# Patient Record
Sex: Male | Born: 1980 | Hispanic: No | Marital: Married | Smoking: Former smoker
Health system: Southern US, Community
[De-identification: ages and names within clinical notes are randomized; demographics above are authoritative.]

## PROBLEM LIST (undated history)

## (undated) DIAGNOSIS — E669 Obesity, unspecified: Secondary | ICD-10-CM

## (undated) DIAGNOSIS — K1120 Sialoadenitis, unspecified: Secondary | ICD-10-CM

## (undated) HISTORY — PX: WISDOM TOOTH EXTRACTION: SHX21

## (undated) HISTORY — DX: Obesity, unspecified: E66.9

## (undated) HISTORY — DX: Sialoadenitis, unspecified: K11.20

---

## 2001-10-17 HISTORY — PX: KNEE SURGERY: SHX244

## 2004-10-17 HISTORY — PX: OTHER SURGICAL HISTORY: SHX169

## 2005-07-03 ENCOUNTER — Ambulatory Visit (HOSPITAL_BASED_OUTPATIENT_CLINIC_OR_DEPARTMENT_OTHER): Admission: RE | Admit: 2005-07-03 | Discharge: 2005-07-03 | Payer: Self-pay | Admitting: Family Medicine

## 2005-07-10 ENCOUNTER — Ambulatory Visit: Payer: Self-pay | Admitting: Internal Medicine

## 2005-09-25 ENCOUNTER — Ambulatory Visit (HOSPITAL_BASED_OUTPATIENT_CLINIC_OR_DEPARTMENT_OTHER): Admission: RE | Admit: 2005-09-25 | Discharge: 2005-09-25 | Payer: Self-pay | Admitting: Family Medicine

## 2005-10-02 ENCOUNTER — Ambulatory Visit: Payer: Self-pay | Admitting: Internal Medicine

## 2007-08-19 LAB — CBC AND DIFFERENTIAL
Hemoglobin: 15.4 g/dL (ref 13.5–17.5)
Platelets: 209 10*3/uL (ref 150–399)

## 2007-11-16 ENCOUNTER — Ambulatory Visit: Payer: Self-pay

## 2007-12-25 ENCOUNTER — Ambulatory Visit: Payer: Self-pay | Admitting: Unknown Physician Specialty

## 2010-08-09 ENCOUNTER — Ambulatory Visit: Payer: Self-pay | Admitting: Family Medicine

## 2010-08-11 ENCOUNTER — Telehealth (INDEPENDENT_AMBULATORY_CARE_PROVIDER_SITE_OTHER): Payer: Self-pay | Admitting: *Deleted

## 2010-10-16 ENCOUNTER — Ambulatory Visit
Admission: RE | Admit: 2010-10-16 | Discharge: 2010-10-16 | Payer: Self-pay | Source: Home / Self Care | Admitting: Family Medicine

## 2010-10-16 LAB — CONVERTED CEMR LAB: Rapid Strep: NEGATIVE

## 2010-10-21 ENCOUNTER — Encounter: Payer: Self-pay | Admitting: Family Medicine

## 2010-11-16 NOTE — Assessment & Plan Note (Signed)
Summary: POSS BROKEN RIB/TJ rm 5   Vital Signs:  Patient Profile:   30 Years Old Male CC:      posterior right sided rib pain post fall today Height:     73 inches Weight:      286.50 pounds O2 Sat:      98 % O2 treatment:    Room Air Temp:     98.9 degrees F oral Pulse rate:   90 / minute Resp:     18 per minute BP sitting:   133 / 88 (left arm)  Pt. in pain?   yes    Location:   right mid back    Intensity:   7    Type:       sharp  Vitals Entered By: Lajean Saver, RN  (August 09, 2010 11:22 AM)                   Updated Prior Medication List: No Medications Current Allergies: No known allergies History of Present Illness Chief Complaint: posterior right sided rib pain post fall today  REVIEW OF SYSTEMS   Respiratory       Comments: painful inspiration    Musculoskeletal       Complains of muscle pain.   Other Comments: Patient was dumping a wheelbarrow this AM lost his balance and fell onto it onhis side then rolledto his back. He is not havingany diffiulty breathing but it is painful to inhale.    Past History:  Past Medical History: Unremarkable  Past Surgical History: Bilateral knee Sx  Family History: Family History Diabetes 1st degree relative Family History of Stroke M 1st degree relative <50  Social History: Married Current Smoker < 1PPWeek Alcohol use-yes 2x a week Drug use-no Regular exercise-yes Smoking Status:  current Drug Use:  no Does Patient Exercise:  yes   Objective:  No acute distress but moves slowly Eyes:  Pupils are equal, round, and reactive to light and accomdation.  Extraocular movement is intact.  Conjunctivae are not inflamed.  Neck:  Full range of motion.  Lungs:  Clear to auscultation.  Breath sounds are equal.  Chest:  Tender right posterior/lateral ribs from beneath scapula to approximately T12 Heart:  Regular rate and rhythm without murmurs, rubs, or gallops.  Abdomen:  Nontender without masses or  hepatosplenomegaly.  Bowel sounds are present.  No CVA or flank tenderness.   Back:  Can heel/toe walk and squat without difficulty.  Decreased forward flexion.  Tenderness in the midline and   right paraspinous muscles from L3 to Sacral area.  Straight leg raising test is negative.  Sitting knee extension test is negative.  Strength and sensation in the lower extremities is normal.  Patellar and achilles reflexes are normal.   RIGHT RIBS AND CHEST - 3+ VIEW   Comparison: None.   Findings:  Heart and lungs normal.  No rib fractures or lesions. No pneumothorax or hemothorax.   IMPRESSION: No acute or significant findings.  LUMBAR SPINE - COMPLETE 4+ VIEW   Comparison: None.   Findings: Normal lumbar segmentation. Bone mineralization is within normal limits.  Normal vertebral height and alignment.  Relatively preserved disc spaces.  No pars fracture.  Sacrum SI joints are within normal limits.   IMPRESSION: No acute fracture or listhesis identified in the lumbar spine.    Assessment New Problems: LOW BACK PAIN, ACUTE (ICD-724.2) CONTUSION, RIGHT CHEST WALL (ICD-922.1) FAMILY HISTORY DIABETES 1ST DEGREE RELATIVE (ICD-V18.0)   Plan New Medications/Changes:  LORTAB 7.5 7.5-500 MG TABS (HYDROCODONE-ACETAMINOPHEN) 1 by mouth q6hr as needed pain  #12 (twelve) x 0, 08/09/2010, Donna Christen MD NAPROXEN 500 MG TABS (NAPROXEN) One by mouth two times a day pc  #20 x 1, 08/09/2010, Donna Christen MD  New Orders: T-DG Ribs Unilateral w/Chest*R* [71101] T-DG Lumbar Spine Complete [72110] Ketorolac-Toradol 15mg  [J1885] Influenza Vaccine NON MCR [00028] Admin of Therapeutic Inj  intramuscular or subcutaneous [96372] Rib Belt [L0210] New Patient Level III [64403] Planning Comments:   Dispensed rb belt:  wear 5 to 7 days.  Begin ice packs.  Begin Naproxen, analgesic. Begin exercises in about 5 days ([facility] information and instruction patient handout given)  Administered flu shot at  wife's request. Follow-up with PCP if not improving.   The patient and/or caregiver has been counseled thoroughly with regard to medications prescribed including dosage, schedule, interactions, rationale for use, and possible side effects and they verbalize understanding.  Diagnoses and expected course of recovery discussed and will return if not improved as expected or if the condition worsens. Patient and/or caregiver verbalized understanding.  Prescriptions: LORTAB 7.5 7.5-500 MG TABS (HYDROCODONE-ACETAMINOPHEN) 1 by mouth q6hr as needed pain  #12 (twelve) x 0   Entered and Authorized by:   Donna Christen MD   Signed by:   Donna Christen MD on 08/09/2010   Method used:   Print then Give to Patient   RxID:   [id number] NAPROXEN 500 MG TABS (NAPROXEN) One by mouth two times a day pc  #20 x 1   Entered and Authorized by:   Donna Christen MD   Signed by:   Donna Christen MD on 08/09/2010   Method used:   Print then Give to Patient   RxID:   [id number]   Medication Administration  Injection # 1:    Medication: Ketorolac-Toradol 15mg     Diagnosis: LOW BACK PAIN, ACUTE (ICD-724.2)    Route: IM    Site: RUOQ gluteus    Exp Date: 05/17/2012    Lot #: [id number]    Mfr: [employer]    Comments: 60 mgs given    Patient tolerated injection without complications    Given by: Emilio Math (August 09, 2010 12:51 PM)  Orders Added: 1)  T-DG Ribs Unilateral w/Chest*R* [71101] 2)  T-DG Lumbar Spine Complete [72110] 3)  Ketorolac-Toradol 15mg  [J1885] 4)  Influenza Vaccine NON MCR [00028] 5)  Admin of Therapeutic Inj  intramuscular or subcutaneous [96372] 6)  Rib Belt [L0210] 7)  New Patient Level III [35573]   Immunizations Administered:  Influenza Vaccine # 1:    Vaccine Type: Fluzone    Site: right deltoid    Mfr: Fluzone    Dose: 0.5 ml    Route: IM    Given by: Emilio Math    Exp. Date: 04/16/2011    Lot #: [id number]    VIS given: 05/11/10 version given August 09, 2010.  Flu Vaccine Consent Questions:    Do you have a history of severe allergic reactions to this vaccine? no    Any prior history of allergic reactions to egg and/or gelatin? no    Do you have a sensitivity to the preservative Thimersol? no    Do you have a past history of Guillan-Barre Syndrome? no    Do you currently have an acute febrile illness? no    Have you ever had a severe reaction to latex? no    Vaccine information given and explained to patient? yes   Immunizations Administered:  Influenza Vaccine # 1:    Vaccine Type: Fluzone    Site: right deltoid    Mfr: Fluzone    Dose: 0.5 ml    Route: IM    Given by: Emilio Math    Exp. Date: 04/16/2011    Lot #: [id number]    VIS given: 05/11/10 version given August 09, 2010.   Medication Administration  Injection # 1:    Medication: Ketorolac-Toradol 15mg     Diagnosis: LOW BACK PAIN, ACUTE (ICD-724.2)    Route: IM    Site: RUOQ gluteus    Exp Date: 05/17/2012    Lot #: [id number]    Mfr: [employer] [facility]    Comments: 60 mgs given    Patient tolerated injection without complications    Given by: Emilio Math (August 09, 2010 12:51 PM)  Orders Added: 1)  T-DG Ribs Unilateral w/Chest*R* [71101] 2)  T-DG Lumbar Spine Complete [72110] 3)  Ketorolac-Toradol 15mg  [J1885] 4)  Influenza Vaccine NON MCR [00028] 5)  Admin of Therapeutic Inj  intramuscular or subcutaneous [96372] 6)  Rib Belt [L0210] 7)  New Patient Level III [11914]

## 2010-11-16 NOTE — Letter (Signed)
Summary: Out of Work  [facility]  [address]   [place], [place] [address]   Phone: [phone]  Fax: [phone]    August 09, 2010   Patient:  Mason Nichols    To Whom It May Concern:   For Medical reasons, Mr. Journey has been prescribed a rib belt which he should wear for 5 to 7 days.    If you need additional information, please feel free to contact our office.         Sincerely,    Donna Christen MD

## 2010-11-16 NOTE — Letter (Signed)
Summary: FLU VAC CONSENT FORM  FLU VAC CONSENT FORM   Imported By: Dannette Barbara 08/09/2010 12:27:28  _____________________________________________________________________  External Attachment:    Type:   Image     Comment:   External Document

## 2010-11-16 NOTE — Progress Notes (Signed)
  Phone Note Outgoing Call Call back at [facility] Phone [phone]   Call placed by: Lajean Saver RN,  August 11, 2010 12:49 PM Call placed to: Patient Summary of Call: Callback: No answer. Message left with reason for call and call back with any questions or concerns.

## 2010-11-16 NOTE — Letter (Signed)
Summary: RELEASE OF RECORDS  RELEASE OF RECORDS   Imported By: Dannette Barbara 08/09/2010 12:30:30  _____________________________________________________________________  External Attachment:    Type:   Image     Comment:   External Document

## 2010-11-18 NOTE — Assessment & Plan Note (Signed)
Summary: PNUMONIA/TM room 5   Vital Signs:  Patient Profile:   30 Years Old Male CC:      Cough Height:     73 inches Weight:      286 pounds O2 Sat:      98 % O2 treatment:    Room Air Temp:     99.5 degrees F oral Pulse rate:   95 / minute Resp:     20 per minute BP sitting:   129 / 88  (left arm) Cuff size:   large  Vitals Entered By: Clemens Catholic LPN (October 16, 2010 9:25 AM)                  Updated Prior Medication List: No Medications Current Allergies (reviewed today): No known allergies History of Present Illness Chief Complaint: Cough History of Present Illness:  Subjective: Patient complains URI symptoms that started 4 days ago with sore throat and nasal congestion.  He has had pneumonia in the past. + productive cough started yesterday. No pleuritic pain No wheezing  + nasal congestion +  post-nasal drainage ? sinus pain/pressure No itchy/red eyes + left earache No hemoptysis No SOB + fever/chills No nausea No vomiting No abdominal pain No diarrhea No skin rashes + fatigue No myalgias No headache Used OTC meds without relief   REVIEW OF SYSTEMS Constitutional Symptoms       Complains of fever, chills, and night sweats.     Denies weight loss, weight gain, and fatigue.  Eyes       Complains of eye drainage, glasses, and contact lenses.      Denies change in vision, eye pain, and eye surgery. Ear/Nose/Throat/Mouth       Complains of ear pain, frequent runny nose, and sore throat.      Denies hearing loss/aids, change in hearing, ear discharge, dizziness, frequent nose bleeds, sinus problems, hoarseness, and tooth pain or bleeding.  Respiratory       Complains of productive cough.      Denies dry cough, wheezing, shortness of breath, asthma, bronchitis, and emphysema/COPD.  Cardiovascular       Denies murmurs, chest pain, and tires easily with exhertion.    Gastrointestinal       Denies stomach pain, nausea/vomiting, diarrhea,  constipation, blood in bowel movements, and indigestion. Genitourniary       Denies painful urination, kidney stones, and loss of urinary control. Neurological       Complains of weakness.      Denies headaches, loss of or changes in sensation, numbness, tngling, tremors, paralysis, seizures, and fainting/blackouts. Musculoskeletal       Complains of muscle pain and joint pain.      Denies joint stiffness, decreased range of motion, redness, swelling, muscle weakness, and gout.  Skin       Denies bruising, unusual mles/lumps or sores, and hair/skin or nail changes.  Psych       Denies mood changes, temper/anger issues, anxiety/stress, speech problems, depression, and sleep problems. Other Comments: pt c/o runny nose x tues PM. This AM he states that it feels like it has moved to his chest and throat. he has yellow/ brown mucus. he has tried OTC mucinex multi-symptom and chloraseptic spray. he also states that his left ear hurts today. pt has a hx of pneumonia.   Past History:  Past Medical History: Reviewed history from 08/09/2010 and no changes required. Unremarkable  Past Surgical History: Bilateral knee Sx salivary gland removed  Family  History: Reviewed history from 08/09/2010 and no changes required. Family History Diabetes 1st degree relative Family History of Stroke M 1st degree relative <50  Social History: Reviewed history from 08/09/2010 and no changes required. Married Current Smoker < 1PPWeek Alcohol use-yes 2x a week Drug use-no Regular exercise-yes   Objective:  Appearance:  Patient appears healthy, stated age, and in no acute distress  Eyes:  Pupils are equal, round, and reactive to light and accomdation.  Extraocular movement is intact.  Conjunctivae are not inflamed.  Ears:  Canals normal.  Tympanic membranes normal.   Nose:  Normal septum.  Normal turbinates, mildly congested.   No sinus tenderness present.  Pharynx:  Normal  Neck:  Supple.  No adenopathy  is present.  No thyromegaly is present  Lungs:  Clear to auscultation.  Breath sounds are equal.  Heart:  Regular rate and rhythm without murmurs, rubs, or gallops.  Abdomen:  Nontender without masses or hepatosplenomegaly.  Bowel sounds are present.  No CVA or flank tenderness.  Extremities:  No edema.   Skin:  No rash Rapid strep test negative  Assessment New Problems: UPPER RESPIRATORY INFECTION, VIRAL (ICD-465.9)  VIRAL URI; WITH HISTORY OF PNEUMONIA, WILL BEGIN ANTIBIOTIC COVERAGE  Plan New Medications/Changes: BENZONATATE 200 MG CAPS (BENZONATATE) One by mouth hs as needed cough  #12 x 0, 10/16/2010, Donna Christen MD AZITHROMYCIN 250 MG TABS (AZITHROMYCIN) Two tabs by mouth on day 1, then 1 tab daily on days 2 through 5  #6 tabs x 0, 10/16/2010, Donna Christen MD  New Orders: Rapid Strep [16109] Pulse Oximetry (single measurment) [94760] Est. Patient Level III [60454] Planning Comments:   Begin Z-pack, expectorant/decongestant, cough suppressant at bedtime.  Increase fluid intake Followup with PCP if not improving 10 to 14 days.   The patient and/or caregiver has been counseled thoroughly with regard to medications prescribed including dosage, schedule, interactions, rationale for use, and possible side effects and they verbalize understanding.  Diagnoses and expected course of recovery discussed and will return if not improved as expected or if the condition worsens. Patient and/or caregiver verbalized understanding.  Prescriptions: BENZONATATE 200 MG CAPS (BENZONATATE) One by mouth hs as needed cough  #12 x 0   Entered and Authorized by:   Donna Christen MD   Signed by:   Donna Christen MD on 10/16/2010   Method used:   Print then Give to Patient   RxID:   [id number] AZITHROMYCIN 250 MG TABS (AZITHROMYCIN) Two tabs by mouth on day 1, then 1 tab daily on days 2 through 5  #6 tabs x 0   Entered and Authorized by:   Donna Christen MD   Signed by:   Donna Christen MD on  10/16/2010   Method used:   Print then Give to Patient   RxID:   [id number]   Patient Instructions: 1)  May use Mucinex D (guaifenesin with decongestant) twice daily for congestion. 2)  Increase fluid intake, rest. 3)  May use Afrin nasal spray (or generic oxymetazoline) twice daily for about 5 days.  Also recommend using saline nasal spray several times daily and/or saline nasal irrigation. 4)  Followup with family doctor if not improving 10 to 14 days  Orders Added: 1)  Rapid Strep [41324] 2)  Pulse Oximetry (single measurment) [94760] 3)  Est. Patient Level III [40102]    Laboratory Results  Date/Time Received: October 16, 2010 10:02 AM  Date/Time Reported: October 16, 2010 10:03 AM   Other Tests  Rapid Strep:  negative  Kit Test Internal QC: Negative   (Normal Range: Negative)

## 2010-11-18 NOTE — Letter (Signed)
Summary: FEE AGREEMENT  FEE AGREEMENT   Imported By: Dannette Barbara 10/21/2010 13:23:52  _____________________________________________________________________  External Attachment:    Type:   Image     Comment:   External Document

## 2011-03-04 NOTE — Procedures (Signed)
Mason Nichols, Mason Nichols NO.:  [id number]   MEDICAL RECORD NO.:  [id number]          PATIENT TYPE:  OUT   LOCATION:  SLEEP CENTER                 FACILITY:  [facility]   PHYSICIAN:  Clinton D. Maple Hudson, M.D. DATE OF BIRTH:  [birth year]   DATE OF STUDY:  07/03/2005                              NOCTURNAL POLYSOMNOGRAM   REFERRING PHYSICIAN:  Dr. Nilda Simmer.   DATE OF STUDY:  July 03, 2005.   INDICATION FOR STUDY:  Hypersomnia with sleep apnea. Epworth sleepiness  score 14/24, BMI 35. Weight 270 pounds.   SLEEP ARCHITECTURE:  Total sleep time 381 minutes with sleep efficiency 83%.  Stage I 10%, stage II 42%, stages III and IV 30%, REM 18% of total sleep  time. Sleep latency 29 minutes, REM latency 161 minutes, awake after sleep  onset 52 minutes, arousal index 34. No bedtime medication taken.   RESPIRATORY DATA:  NPSG protocol requested. Apnea/hypopnea index (AHI, RDI)  45 obstructive events per hour indicating moderately severe obstructive  sleep apnea/hypopnea syndrome. There were 4 central apneas, 111 obstructive  apneas, 1 mixed apnea, and 170 hypopneas. Events were positional with most  reported while sleeping supine. REM RDI 81.   OXYGEN DATA:  Moderately loud snoring with oxygen desaturation to a nadir of  55%. Mean oxygen saturation through the study was 96% on room air.   CARDIAC DATA:  Normal sinus rhythm.   MOVEMENT/PARASOMNIA:  A total of 33 limb jerks were reported of which 13  were associated with arousal or awakening for periodic limb movement with  arousal index of 2 per hour which is probably insignificant.   IMPRESSION/RECOMMENDATIONS:  1.  Moderately severe obstructive sleep apnea / hypopnea syndrome,      apnea/hypopnea index 45 per hour, with moderately loud snoring and      oxygen desaturation to 79%. Events were more frequent while supine and      in REM.  2.  Scores in this range are unlikely to be sufficiently corrected by change      in  sleep position and REM suppression.      Weight loss and correction for any significant nasal congestion should      be considered. Consider returning for CPAP titration or evaluate for      alternative therapies as appropriate.      Clinton D. Maple Hudson, M.D.  Diplomate, Biomedical engineer of Sleep Medicine  Electronically Signed     CDY/MEDQ  D:  07/10/2005 12:23:15  T:  07/10/2005 22:56:58  Job:  [id number]

## 2011-03-04 NOTE — Procedures (Signed)
NAMELEOPOLDO, Mason Nichols NO.:  [id number]   MEDICAL RECORD NO.:  [id number]          PATIENT TYPE:  OUT   LOCATION:  SLEEP CENTER                 FACILITY:  [facility]   PHYSICIAN:  Clinton D. Maple Hudson, M.D. DATE OF BIRTH:  [birth year]   DATE OF STUDY:  09/25/2005                              NOCTURNAL POLYSOMNOGRAM   REFERRING PHYSICIAN:  Dr. Nilda Simmer.   DATE OF STUDY:  September 25, 2005.   INDICATION FOR STUDY:  Hypersomnia with sleep apnea.   EPWORTH SLEEPINESS SCORE:  17/24.   BMI:  36.   WEIGHT:  270 pounds.   A baseline diagnostic NPSG on July 03, 2005 reported an AHI of 45 per  hour. C-PAP titration is requested.   SLEEP ARCHITECTURE:  Total sleep time 336 minutes with sleep efficiency 92%.  Stage I was 2%, stage II 44%, stages III and IV 31%, indicating rebound. REM  was 22% of total sleep time. Sleep latency 9.5 minutes, REM latency 114  minutes, awake after sleep onset 21 minutes, arousal index 14.6. No bedtime  medication was taken.   RESPIRATORY DATA:  C-PAP titration protocol:  C-PAP was titrated to 8 CWP,  AHI of 2.3 per hour. A ResMed Swift with medium nasal pillows was used along  with a heated humidifier.   OXYGEN DATA:  Snoring was eliminated by C-PAP and oxygen held and a mean of  97% with C-PAP control on room air.   CARDIAC DATA:  Normal sinus rhythm.   MOVEMENT/PARASOMNIA:  A total of 87 limb jerks were reported of which 27  were associated with arousal or awakening for periodic limb movement with  arousal index of 4.8 per hour which is mildly increased but may have been  partly a response to initiation of C-PAP.   IMPRESSION/RECOMMENDATIONS:  1.  Successful C-PAP titration to 8 CWP for an AHI of 2.3 per hour. A ResMed      Swift with medium nasal pillows and heated humidifier was used.  2.  Increases in stage III, IV and REM suggests rebound with successful      control of C-PAP and restoration of normal sleep architecture.  3.   Mild periodic limb movement with arousal, 4.8 per hour.  4.  Baseline diagnostic NPSG on July 03, 2005 had reported an AHI of 45      per hour.      Clinton D. Maple Hudson, M.D.  Diplomate, Biomedical engineer of Sleep Medicine  Electronically Signed     CDY/MEDQ  D:  10/02/2005 12:22:40  T:  10/02/2005 22:24:29  Job:  [id number]

## 2012-07-08 ENCOUNTER — Emergency Department (INDEPENDENT_AMBULATORY_CARE_PROVIDER_SITE_OTHER): Payer: BC Managed Care – PPO

## 2012-07-08 ENCOUNTER — Emergency Department (INDEPENDENT_AMBULATORY_CARE_PROVIDER_SITE_OTHER)
Admission: EM | Admit: 2012-07-08 | Discharge: 2012-07-08 | Disposition: A | Discharge status: Home / Self Care | Payer: BC Managed Care – PPO | Source: Home / Self Care | Attending: Family Medicine | Admitting: Family Medicine

## 2012-07-08 ENCOUNTER — Encounter: Payer: Self-pay | Admitting: *Deleted

## 2012-07-08 DIAGNOSIS — S6390XA Sprain of unspecified part of unspecified wrist and hand, initial encounter: Secondary | ICD-10-CM

## 2012-07-08 DIAGNOSIS — X500XXA Overexertion from strenuous movement or load, initial encounter: Secondary | ICD-10-CM

## 2012-07-08 DIAGNOSIS — M79609 Pain in unspecified limb: Secondary | ICD-10-CM

## 2012-07-08 DIAGNOSIS — S6391XA Sprain of unspecified part of right wrist and hand, initial encounter: Secondary | ICD-10-CM

## 2012-07-08 NOTE — ED Provider Notes (Signed)
History     CSN: [id number]  Arrival date & time 07/08/12  1330   First MD Initiated Contact with Patient 07/08/12 1356      Chief Complaint  Patient presents with  . Hand Pain    R     HPI Comments: Pt was lifting weights at the gym yesterday and then started to have sharp shooting pain in right hand over 4th and 5th fingers.   Pain 8/10.  Pt injured same area of hand years ago.  Patient is a 31 y.o. male presenting with hand pain. The history is provided by the patient.  Hand Pain The current episode started yesterday. The problem occurs constantly. The problem has been gradually worsening. Associated symptoms comments: none. Exacerbated by: grasping with right hand. Nothing relieves the symptoms. He has tried nothing for the symptoms.    History reviewed. No pertinent past medical history.  Past Surgical History  Procedure Date  . Knee surgery     bilateral  . Salivary gland removal     L  . Wisdom tooth extraction     Family History  Problem Relation Age of Onset  . Stroke Father     History  Substance Use Topics  . Smoking status: Former Games developer  . Smokeless tobacco: Never Used  . Alcohol Use: Yes      Review of Systems  All other systems reviewed and are negative.    Allergies  Review of patient's allergies indicates no known allergies.  Home Medications  No current outpatient prescriptions on file.  BP 126/84  Pulse 71  Temp 98 F (36.7 C) (Oral)  Resp 16  Ht 6' (1.829 m)  Wt 284 lb 4 oz (128.935 kg)  BMI 38.55 kg/m2  SpO2 98%  Physical Exam  Nursing note and vitals reviewed. Constitutional: He is oriented to person, place, and time. He appears well-developed and well-nourished. No distress.       Patient is obese (BMI 38.6)  Eyes: Conjunctivae normal are normal. Pupils are equal, round, and reactive to light.  Musculoskeletal: He exhibits tenderness.       Right hand: He exhibits decreased range of motion, tenderness and bony tenderness.  He exhibits normal two-point discrimination, normal capillary refill, no deformity, no laceration and no swelling. normal sensation noted.       Hands:      There is tenderness over the right 4th and 5th metacarpals as noted on diagram.  Distal Neurovascular function is intact.   Neurological: He is alert and oriented to person, place, and time.  Skin: Skin is warm and dry. No rash noted.    ED Course  Procedures  Ulnar gutter splint applied right hand   Dg Hand Complete Right  07/08/2012  *RADIOLOGY REPORT*  Clinical Data: Right hand pain after weight lifting.  RIGHT HAND - COMPLETE 3+ VIEW  Comparison: None.  Findings: No fracture, foreign body, or acute bony findings are identified.  IMPRESSION:  No significant abnormality identified.   Original Report Authenticated By: Dellia Cloud, M.D.      1. Sprain of hand, right       MDM  Ulnar Gutter Splint applied. Apply ice pack for 30 minutes, 2 or 3 times daily.  Continue until pain decreases.  Wear brace for about 5 to 7 days.  May take Ibuprofen 200mg , 4 tabs every 8 hours with food.  Followup with Dr. Rodney Langton if not improving one month.        Jeannett Senior  Jillyn Hidden, MD 07/12/12 1617

## 2012-07-08 NOTE — ED Notes (Signed)
Pt was at the gym yesterday and then started to have sharp shooting pain in R hand near pinky and ring finger.  Pain 8/10.  Pt injured same area of hand years ago.

## 2012-10-16 ENCOUNTER — Emergency Department (INDEPENDENT_AMBULATORY_CARE_PROVIDER_SITE_OTHER)
Admission: EM | Admit: 2012-10-16 | Discharge: 2012-10-16 | Disposition: A | Discharge status: Home / Self Care | Payer: BC Managed Care – PPO | Source: Home / Self Care | Attending: Family Medicine | Admitting: Family Medicine

## 2012-10-16 ENCOUNTER — Encounter: Payer: Self-pay | Admitting: *Deleted

## 2012-10-16 DIAGNOSIS — J039 Acute tonsillitis, unspecified: Secondary | ICD-10-CM

## 2012-10-16 DIAGNOSIS — J029 Acute pharyngitis, unspecified: Secondary | ICD-10-CM

## 2012-10-16 DIAGNOSIS — R599 Enlarged lymph nodes, unspecified: Secondary | ICD-10-CM

## 2012-10-16 LAB — POCT CBC W AUTO DIFF (K'VILLE URGENT CARE)

## 2012-10-16 LAB — POCT RAPID STREP A (OFFICE): Rapid Strep A Screen: NEGATIVE

## 2012-10-16 MED ORDER — PREDNISONE 20 MG PO TABS: 20.0000 mg | ORAL_TABLET | Freq: Two times a day (BID) | ORAL | Status: DC

## 2012-10-16 MED ORDER — METHYLPREDNISOLONE SODIUM SUCC 125 MG IJ SOLR
125.0000 mg | Freq: Once | INTRAMUSCULAR | Status: AC
  Administered 2012-10-16: 125 mg via INTRAMUSCULAR

## 2012-10-16 MED ORDER — CEFTRIAXONE SODIUM 1 G IJ SOLR
1.0000 g | Freq: Once | INTRAMUSCULAR | Status: AC
  Administered 2012-10-16: 1 g via INTRAMUSCULAR

## 2012-10-16 MED ORDER — AMOXICILLIN-POT CLAVULANATE 875-125 MG PO TABS: 1.0000 | ORAL_TABLET | Freq: Two times a day (BID) | ORAL | Status: DC

## 2012-10-16 NOTE — ED Provider Notes (Addendum)
History     CSN: [id number]  Arrival date & time 10/16/12  1754   First MD Initiated Contact with Patient 10/16/12 1833      Chief Complaint  Patient presents with  . Otalgia  . Lymphadenopathy  . Sore Throat      HPI Comments: Patient complains of onset of mild sore throat two days ago and pain in left ear.  Yesterday he developed myalgias.  He has also developed pain in mouth after eating sour food such as lemons.  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  Today he has had chills.  No URI symptoms such as nasal congestion or cough.  No difficulty swallowing or opening jaw.  The history is provided by the patient.    History reviewed. No pertinent past medical history.  Past Surgical History  Procedure Date  . Knee surgery     bilateral  . Salivary gland removal     L  . Wisdom tooth extraction     Family History  Problem Relation Age of Onset  . Stroke Father     History  Substance Use Topics  . Smoking status: Former Smoker    Quit date: 10/17/2011  . Smokeless tobacco: Never Used  . Alcohol Use: Yes      Review of Systems + sore throat No cough No pleuritic pain No wheezing No nasal congestion No post-nasal drainage No sinus pain/pressure No itchy/red eyes + left earache No hemoptysis No SOB + fever, + chills No nausea No vomiting No abdominal pain No diarrhea No urinary symptoms No skin rashes + fatigue + myalgias + headache   Allergies  Review of patient's allergies indicates no known allergies.  Home Medications   Current Outpatient Rx  Name  Route  Sig  Dispense  Refill  . AMOXICILLIN-POT CLAVULANATE 875-125 MG PO TABS   Oral   Take 1 tablet by mouth 2 (two) times daily. Take with food   20 tablet   0   . PREDNISONE 20 MG PO TABS   Oral   Take 1 tablet (20 mg total) by mouth 2 (two) times daily.   10 tablet   0     BP 126/83  Pulse 94  Temp 99 F (37.2 C) (Oral)  Resp 16   Ht 6\' 1"  (1.854 m)  Wt 286 lb (129.729 kg)  BMI 37.73 kg/m2  SpO2 98%  Physical Exam Nursing notes and Vital Signs reviewed. Appearance:  Patient appears stated age, and in no acute distress.  Patient is obese (BMI 37.7)  Head:  No facial assymmetry  Eyes:  Pupils are equal, round, and reactive to light and accomodation.  Extraocular movement is intact.  Conjunctivae are not inflamed  Ears:  Canals normal.  Tympanic membranes normal.  Nose:  Mildly congested turbinates.  No sinus tenderness. Mouth:  Tongue midline.  No tooth or gingival tenderness.  No tenderness buccal mucosae   Pharynx:  Erythematous, worse on left with increased swelling left tonsil.  No exudate Neck:  Supple.   Tender enlarged left anterior/posterior nodes   Lungs:  Clear to auscultation.  Breath sounds are equal.  Heart:  Regular rate and rhythm without murmurs, rubs, or gallops.  Abdomen:  Nontender without masses or hepatosplenomegaly.  Bowel sounds are present.  No CVA or flank tenderness.  Skin:  No rash present.   ED Course  Procedures none   Labs Reviewed  POCT RAPID STREP A (OFFICE) negative  POCT CBC W AUTO DIFF ([facility])   WBC 17.2; LY 17.2; MO 2.2; GR 80.6; Hgb 14.8; Platelets 270   STREP A DNA PROBE pending      1. Swollen lymph nodes   2. Sore throat   3. Tonsillitis; note leukocytosis 17.2       MDM  Patient at risk for developing tonsillar abscess Rocephin 1gm IM.  Solumedrol 125mg  IM. Tomorrow morning begin Augmentin and prednisone burst. Begin warm salt water gargles several times daily.  Discussed red flags for immediate evaluation:  Inability to swallow saliva or difficulty breathing, increasing pain or difficulty opening mouth, etc. If symptoms become significantly worse during the night or over the holiday, proceed to the local emergency room. Followup with ENT if not improving in two days.        Lattie Haw, MD 10/16/12 1952  Addendum:   Followup  call to patient:  Feels much better!  Decreased throat pain/swelling.  Lattie Haw, MD 10/17/12 1154

## 2012-10-16 NOTE — ED Notes (Signed)
Patient c/o left ear burning, pain behind left ear, painful swallowing and left sided sore throat x 3 days. He has a hx of salivary gland inflammation that was later removed.

## 2012-10-18 ENCOUNTER — Telehealth: Payer: Self-pay | Admitting: *Deleted

## 2012-10-18 NOTE — ED Notes (Unsigned)
Pts wife called states that the pts tonsilitis has improved but he is still having alot of pain. per dr Orson Aloe called vicodin 5/325mg  i PO Q6hrs prn pain #5/0RFs to [facility] [place]. pt informed of rx and that we will schedule appt for him to see ENT on 10/19/12.

## 2012-10-19 ENCOUNTER — Telehealth: Payer: Self-pay | Admitting: *Deleted

## 2012-11-02 ENCOUNTER — Ambulatory Visit (INDEPENDENT_AMBULATORY_CARE_PROVIDER_SITE_OTHER): Payer: BC Managed Care – PPO | Admitting: Family Medicine

## 2012-11-02 ENCOUNTER — Encounter: Payer: Self-pay | Admitting: Family Medicine

## 2012-11-02 VITALS — BP 149/78 | HR 75 | Ht 72.0 in | Wt 287.0 lb

## 2012-11-02 DIAGNOSIS — K1120 Sialoadenitis, unspecified: Secondary | ICD-10-CM

## 2012-11-02 DIAGNOSIS — Z0000 Encounter for general adult medical examination without abnormal findings: Secondary | ICD-10-CM

## 2012-11-02 DIAGNOSIS — E669 Obesity, unspecified: Secondary | ICD-10-CM

## 2012-11-02 DIAGNOSIS — Z13220 Encounter for screening for lipoid disorders: Secondary | ICD-10-CM

## 2012-11-02 DIAGNOSIS — R030 Elevated blood-pressure reading, without diagnosis of hypertension: Secondary | ICD-10-CM

## 2012-11-02 HISTORY — DX: Obesity, unspecified: E66.9

## 2012-11-02 HISTORY — DX: Sialoadenitis, unspecified: K11.20

## 2012-11-02 NOTE — Progress Notes (Signed)
CC: Mason Nichols is a 32 y.o. male is here for Establish Care  Colonoscopy: Not indicated, no family history of colon cancer  Prostate: Discussed screening risks/beneifts with patient on 11/02/2012. No family history of prostate cancer we'll consider screening at age 40  Influenza Vaccine: Declines 11/02/2012 Pneumovax: not indicated  Td/Tdap: Td 2011 Zoster: Not indicated due to age  Subjective: HPI:  No acute complaints here for physical exam.  Over the past 2 weeks have you been bothered by: - Little interest or pleasure in doing things: No - Feeling down depressed or hopeless:  No  Rare alcohol use, has quit tobacco for over a year, no recreational or street drug use. Exercises at a gym off and on, would like to do it more often. Tries to watch what he eats focusing on a low-fat diet.  Has some left facial tenderness and has a ENT appointment later today.  Review of Systems - General ROS: negative for - chills, fever, night sweats, weight gain or weight loss Ophthalmic ROS: negative for - decreased vision Psychological ROS: negative for - anxiety or depression ENT ROS: negative for - hearing change, nasal congestion, tinnitus or allergies Hematological and Lymphatic ROS: negative for - bleeding problems, bruising or swollen lymph nodes Breast ROS: negative Respiratory ROS: no cough, shortness of breath, or wheezing Cardiovascular ROS: no chest pain or dyspnea on exertion Gastrointestinal ROS: no abdominal pain, change in bowel habits, or black or bloody stools Genito-Urinary ROS: negative for - genital discharge, genital ulcers, incontinence or abnormal bleeding from genitals Musculoskeletal ROS: negative for - joint pain or muscle pain Neurological ROS: negative for - headaches or memory loss Dermatological ROS: negative for lumps, mole changes, rash and skin lesion changes  Past Medical History  Diagnosis Date  . Sialadenitis (History of) 11/02/2012  . Obesity 11/02/2012      Family History  Problem Relation Age of Onset  . Stroke Father      History  Substance Use Topics  . Smoking status: Former Smoker    Quit date: 10/17/2011  . Smokeless tobacco: Never Used  . Alcohol Use: Yes     Objective: Filed Vitals:   11/02/12 1331  BP: 149/78  Pulse: 75    General: No Acute Distress HEENT: Atraumatic, normocephalic, conjunctivae normal without scleral icterus.  No nasal discharge, hearing grossly intact, TMs with good landmarks bilaterally with no middle ear abnormalities, posterior pharynx clear without oral lesions. Trace swelling over left parotid gland which is nontender Neck: Supple, trachea midline, no cervical nor supraclavicular adenopathy. Pulmonary: Clear to auscultation bilaterally without wheezing, rhonchi, nor rales. Cardiac: Regular rate and rhythm.  No murmurs, rubs, nor gallops. No peripheral edema.  2+ peripheral pulses bilaterally. Abdomen: Bowel sounds normal.  No masses.  Non-tender without rebound.  Negative Murphy's sign. GU: Declined  MSK: Grossly intact, no signs of weakness.  Full strength throughout upper and lower extremities.  Full ROM in upper and lower extremities.  No midline spinal tenderness. Neuro: Gait unremarkable, CN II-XII grossly intact.  C5-C6 Reflex 2/4 Bilaterally, L4 Reflex 2/4 Bilaterally.  Cerebellar function intact. Skin: No rashes. Psych: Alert and oriented to person/place/time.  Thought process normal. No anxiety/depression.  Assessment & Plan: [name] was seen today for establish care.  Diagnoses and associated orders for this visit:  Annual physical exam  Elevated blood pressure - BASIC METABOLIC PANEL WITH GFR  Obesity - TSH  Lipid screening - Lipid panel    Health maintenance topics updated above history of  present illness, patient climbs flu shot. Healthy lifestyle habits discussed with patient and handout was given to emphasize the benefits of these changes/interventions. Rule out  hypothyroidism given obesity, overdue for a lipid screening, overdue for type 2 diabetes screening. Asked him to return in 4 weeks with a blood pressure log to determine if he needs to start antihypertensive medication. He'll be going to ENT this afternoon do to swelling of the left parotid gland with a history of submandibular gland removal  Return in about 4 weeks (around 11/30/2012) for BP.

## 2012-11-02 NOTE — Patient Instructions (Signed)
Dr. Maryfrances Nichols's General Advice Following Your Complete Physical Exam  The Benefits of Regular Exercise: Unless you suffer from an uncontrolled cardiovascular condition, studies strongly suggest that regular exercise and physical activity will add to both the quality and length of your life.  The World Health Organization recommends 150 minutes of moderate intensity aerobic activity every week.  This is best split over 3-4 days a week, and can be as simple as a brisk walk for just over 35 minutes "most days of the week".  This type of exercise has been shown to lower LDL-Cholesterol, lower average blood sugars, lower blood pressure, lower cardiovascular disease risk, improve memory, and increase one's overall sense of wellbeing.  The addition of anaerobic (or "strength training") exercises offers additional benefits including but not limited to increased metabolism, prevention of osteoporosis, and improved overall cholesterol levels.  How Can I Strive For A Low-Fat Diet?: Current guidelines recommend that 25-35 percent of your daily energy (food) intake should come from fats.  One might ask how can this be achieved without having to dissect each meal on a daily basis?  Switch to skim or 1% milk instead of whole milk.  Focus on lean meats such as ground turkey, fresh fish, baked chicken, and lean cuts of beef as your source of dietary protein.  Consume less than 300mg/day of dietary cholesterol.  Limit trans fatty acid consumption primarily by limiting synthetic trans fats such as partially hydrogenated oils (Ex: fried fast foods).  Focus efforts on reducing your intake of "solid" fats (Ex: Butter).  Substitute olive or vegetable oil for solid fats where possible.  Moderation of Salt Intake: Provided you don't carry a diagnosis of congestive heart failure nor renal failure, I recommend a daily allowance of no more than 2300 mg of salt (sodium).  Keeping under this daily goal is associated with a  decreased risk of cardiovascular events, creeping above it can lead to elevated blood pressures and increases your risk of cardiovascular events.  Milligrams (mg) of salt is listed on all nutrition labels, and your daily intake can add up faster than you think.  Most canned and frozen dinners can pack in over half your daily salt allowance in one meal.    Lifestyle Health Risks: Certain lifestyle choices carry specific health risks.  As you may already know, tobacco use has been associated with increasing one's risk of cardiovascular disease, pulmonary disease, numerous cancers, among many other issues.  What you may not know is that there are medications and nicotine replacement strategies that can more than double your chances of successfully quitting.  I would be thrilled to help manage your quitting strategy if you currently use tobacco products.  When it comes to alcohol use, I've yet to find an "ideal" daily allowance.  Provided an individual does not have a medical condition that is exacerbated by alcohol consumption, general guidelines determine "safe drinking" as no more than two standard drinks for a man or no more than one standard drink for a male per day.  However, much debate still exists on whether any amount of alcohol consumption is technically "safe".  My general advice, keep alcohol consumption to a minimum for general health promotion.  If you or others believe that alcohol, tobacco, or recreational drug use is interfering with your life, I would be happy to provide confidential counseling regarding treatment options.  General "Over The Counter" Nutrition Advice: Postmenopausal women should aim for a daily calcium intake of 1200 mg, however a significant   portion of this might already be provided by diets including milk, yogurt, cheese, and other dairy products.  Vitamin D has been shown to help preserve bone density, prevent fatigue, and has even been shown to help reduce falls in the  elderly.  Ensuring a daily intake of 800 Units of Vitamin D is a good place to start to enjoy the above benefits, we can easily check your Vitamin D level to see if you'd potentially benefit from supplementation beyond 800 Units a day.  Folic Acid intake should be of particular concern to women of childbearing age.  Daily consumption of 400-800 mcg of Folic Acid is recommended to minimize the chance of spinal cord defects in a fetus should pregnancy occur.    For many adults, accidents still remain one of the most common culprits when it comes to cause of death.  Some of the simplest but most effective preventitive habits you can adopt include regular seatbelt use, proper helmet use, securing firearms, and regularly testing your smoke and carbon monoxide detectors.  Mason Nichols B. Mason Hellums DO [facility] [address], [address] [place],  [address] Phone: [phone]  

## 2012-11-08 ENCOUNTER — Encounter: Payer: Self-pay | Admitting: Family Medicine

## 2012-11-08 DIAGNOSIS — Z8659 Personal history of other mental and behavioral disorders: Secondary | ICD-10-CM | POA: Insufficient documentation

## 2012-11-09 ENCOUNTER — Encounter: Payer: Self-pay | Admitting: *Deleted

## 2012-12-07 LAB — BASIC METABOLIC PANEL WITH GFR
BUN: 13 mg/dL (ref 6–23)
CO2: 26 mEq/L (ref 19–32)
Chloride: 104 mEq/L (ref 96–112)
Creat: 0.89 mg/dL (ref 0.50–1.35)
GFR, Est Non African American: >89 mL/min
Glucose, Bld: 99 mg/dL (ref 70–99)
Potassium: 4.3 mEq/L (ref 3.5–5.3)

## 2012-12-07 LAB — LIPID PANEL
HDL: 49 mg/dL (ref >39)
LDL Cholesterol: 119 mg/dL — ABNORMAL HIGH (ref 0–99)
Total CHOL/HDL Ratio: 3.6 Ratio
VLDL: 9 mg/dL (ref 0–40)

## 2012-12-10 ENCOUNTER — Encounter: Payer: Self-pay | Admitting: Family Medicine

## 2012-12-10 DIAGNOSIS — E785 Hyperlipidemia, unspecified: Secondary | ICD-10-CM

## 2013-11-29 ENCOUNTER — Encounter: Payer: BC Managed Care – PPO | Admitting: Family Medicine

## 2013-11-29 ENCOUNTER — Ambulatory Visit (INDEPENDENT_AMBULATORY_CARE_PROVIDER_SITE_OTHER): Payer: BC Managed Care – PPO | Admitting: Family Medicine

## 2013-11-29 ENCOUNTER — Encounter: Payer: Self-pay | Admitting: Family Medicine

## 2013-11-29 VITALS — BP 151/89 | HR 74 | Ht 73.0 in | Wt 301.0 lb

## 2013-11-29 DIAGNOSIS — R030 Elevated blood-pressure reading, without diagnosis of hypertension: Secondary | ICD-10-CM

## 2013-11-29 DIAGNOSIS — Z0000 Encounter for general adult medical examination without abnormal findings: Secondary | ICD-10-CM

## 2013-11-29 DIAGNOSIS — E785 Hyperlipidemia, unspecified: Secondary | ICD-10-CM

## 2013-11-29 DIAGNOSIS — IMO0001 Reserved for inherently not codable concepts without codable children: Secondary | ICD-10-CM

## 2013-11-29 DIAGNOSIS — Z131 Encounter for screening for diabetes mellitus: Secondary | ICD-10-CM

## 2013-11-29 NOTE — Progress Notes (Signed)
CC: Mason Nichols is a 33 y.o. male is here for Annual Exam   Subjective: HPI:  Colonoscopy: No family history of colon cancer will begin age 53 screening Prostate: Discussed screening risks/beneifts with patient today no family history of prostate cancer will begin screening options at age 40  Influenza Vaccine: Declines today Pneumovax: No current indication Td/Tdap: Td 2011 Zoster: (Start 33 yo)  Rare alcohol use no tobacco or recreational drug use. No formal exercise routine tries to watch what he eats but admits room for improvement  No acute complaints today   Review of Systems - General ROS: negative for - chills, fever, night sweats, weight gain or weight loss Ophthalmic ROS: negative for - decreased vision Psychological ROS: negative for - anxiety or depression ENT ROS: negative for - hearing change, nasal congestion, tinnitus or allergies Hematological and Lymphatic ROS: negative for - bleeding problems, bruising or swollen lymph nodes Breast ROS: negative Respiratory ROS: no cough, shortness of breath, or wheezing Cardiovascular ROS: no chest pain or dyspnea on exertion Gastrointestinal ROS: no abdominal pain, change in bowel habits, or black or bloody stools Genito-Urinary ROS: negative for - genital discharge, genital ulcers, incontinence or abnormal bleeding from genitals Musculoskeletal ROS: negative for - joint pain or muscle pain Neurological ROS: negative for - headaches or memory loss Dermatological ROS: negative for lumps, mole changes, rash and skin lesion changes  Past Medical History  Diagnosis Date  . Sialadenitis (History of) 11/02/2012  . Obesity 11/02/2012    Past Surgical History  Procedure Laterality Date  . Knee surgery  2003    bilateral  . Salivary gland removal  2006    L  . Wisdom tooth extraction     Family History  Problem Relation Age of Onset  . Stroke Father     History   Social History  . Marital Status: Married    Spouse  Name: N/A    Number of Children: N/A  . Years of Education: N/A   Occupational History  . Not on file.   Social History Main Topics  . Smoking status: Former Smoker    Quit date: 10/17/2011  . Smokeless tobacco: Never Used  . Alcohol Use: Yes  . Drug Use: No  . Sexual Activity: Yes   Other Topics Concern  . Not on file   Social History Narrative  . No narrative on file     Objective: BP 151/89  Pulse 74  Ht 6\' 1"  (1.854 m)  Wt 301 lb (136.533 kg)  BMI 39.72 kg/m2  General: No Acute Distress HEENT: Atraumatic, normocephalic, conjunctivae normal without scleral icterus.  No nasal discharge, hearing grossly intact, TMs with good landmarks bilaterally with no middle ear abnormalities, posterior pharynx clear without oral lesions. Neck: Supple, trachea midline, no cervical nor supraclavicular adenopathy. Pulmonary: Clear to auscultation bilaterally without wheezing, rhonchi, nor rales. Cardiac: Regular rate and rhythm.  No murmurs, rubs, nor gallops. No peripheral edema.  2+ peripheral pulses bilaterally. Abdomen: Bowel sounds normal.  No masses.  Non-tender without rebound.  Negative Murphy's sign. GU: Bilateral descended testes without inguinal hernia MSK: Grossly intact, no signs of weakness.  Full strength throughout upper and lower extremities.  Full ROM in upper and lower extremities.  No midline spinal tenderness. Neuro: Gait unremarkable, CN II-XII grossly intact.  C5-C6 Reflex 2/4 Bilaterally, L4 Reflex 2/4 Bilaterally.  Cerebellar function intact. Skin: No rashes. Psych: Alert and oriented to person/place/time.  Thought process normal. No anxiety/depression.  Assessment & Plan: [name]  was seen today for annual exam.  Diagnoses and associated orders for this visit:  Annual physical exam  Elevated blood pressure - BASIC METABOLIC PANEL WITH GFR  Hyperlipidemia LDL goal < 160 - Lipid panel  Diabetes mellitus screening - BASIC METABOLIC PANEL WITH  GFR    Healthy lifestyle interventions including but not limited to regular exercise, a healthy low fat diet, moderation of salt intake, the dangers of tobacco/alcohol/recreational drug use, nutrition supplementation, and accident avoidance were discussed with the patient and a handout was provided for future reference. Due for repeat routine dyslipidemia lipid check Due for annual diabetic screening We discussed the DASH diet to help non-pharmaceutical interventions to keep blood pressure down I've asked him to return in 1-2 months for repeat blood pressure check to determine if he should start on antihypertensives   Return in about 4 weeks (around 12/27/2013) for Blood pressure check.

## 2013-11-29 NOTE — Patient Instructions (Signed)
DASH Diet The DASH diet stands for "Dietary Approaches to Stop Hypertension." It is a healthy eating plan that has been shown to reduce high blood pressure (hypertension) in as little as 14 days, while also possibly providing other significant health benefits. These other health benefits include reducing the risk of breast cancer after menopause and reducing the risk of type 2 diabetes, heart disease, colon cancer, and stroke. Health benefits also include weight loss and slowing kidney failure in patients with chronic kidney disease.  DIET GUIDELINES  Limit salt (sodium). Your diet should contain less than 1500 mg of sodium daily.  Limit refined or processed carbohydrates. Your diet should include mostly whole grains. Desserts and added sugars should be used sparingly.  Include small amounts of heart-healthy fats. These types of fats include nuts, oils, and tub margarine. Limit saturated and trans fats. These fats have been shown to be harmful in the body. CHOOSING FOODS  The following food groups are based on a 2000 calorie diet. See your Registered Dietitian for individual calorie needs. Grains and Grain Products (6 to 8 servings daily)  Eat More Often: Whole-wheat bread, brown rice, whole-grain or wheat pasta, quinoa, popcorn without added fat or salt (air popped).  Eat Less Often: White bread, white pasta, white rice, cornbread. Vegetables (4 to 5 servings daily)  Eat More Often: Fresh, frozen, and canned vegetables. Vegetables may be raw, steamed, roasted, or grilled with a minimal amount of fat.  Eat Less Often/Avoid: Creamed or fried vegetables. Vegetables in a cheese sauce. Fruit (4 to 5 servings daily)  Eat More Often: All fresh, canned (in natural juice), or frozen fruits. Dried fruits without added sugar. One hundred percent fruit juice ( cup [237 mL] daily).  Eat Less Often: Dried fruits with added sugar. Canned fruit in light or heavy syrup. [employer], Fish, and Poultry (2  servings or less daily. One serving is 3 to 4 oz [85-114 g]).  Eat More Often: Ninety percent or leaner ground beef, tenderloin, sirloin. Round cuts of beef, chicken breast, [place] breast. All fish. Grill, bake, or broil your meat. Nothing should be fried.  Eat Less Often/Avoid: Fatty cuts of meat, [place], or chicken leg, thigh, or wing. Fried cuts of meat or fish. Dairy (2 to 3 servings)  Eat More Often: Low-fat or fat-free milk, low-fat plain or light yogurt, reduced-fat or part-skim cheese.  Eat Less Often/Avoid: Milk (whole, 2%).Whole milk yogurt. Full-fat cheeses. Nuts, Seeds, and Legumes (4 to 5 servings per week)  Eat More Often: All without added salt.  Eat Less Often/Avoid: Salted nuts and seeds, canned beans with added salt. Fats and Sweets (limited)  Eat More Often: Vegetable oils, tub margarines without trans fats, sugar-free gelatin. Mayonnaise and salad dressings.  Eat Less Often/Avoid: Coconut oils, palm oils, butter, stick margarine, cream, half and half, cookies, candy, pie. FOR MORE INFORMATION The Dash Diet Eating Plan: www.dashdiet.org Document Released: 09/22/2011 Document Revised: 12/26/2011 Document Reviewed: 09/22/2011 [facility] Patient Information 2014 [place], [place].  Dr. Genelle Bal General Advice Following Your Complete Physical Exam  The Benefits of Regular Exercise: Unless you suffer from an uncontrolled cardiovascular condition, studies strongly suggest that regular exercise and physical activity will add to both the quality and length of your life.  The World Health Organization recommends 150 minutes of moderate intensity aerobic activity every week.  This is best split over 3-4 days a week, and can be as simple as a brisk walk for just over 35 minutes "most days of the week".  This type of exercise has been shown to lower LDL-Cholesterol, lower average blood sugars, lower blood pressure, lower cardiovascular disease risk, improve memory, and increase one's  overall sense of wellbeing.  The addition of anaerobic (or "strength training") exercises offers additional benefits including but not limited to increased metabolism, prevention of osteoporosis, and improved overall cholesterol levels.  How Can I Strive For A Low-Fat Diet?: Current guidelines recommend that 25-35 percent of your daily energy (food) intake should come from fats.  One might ask how can this be achieved without having to dissect each meal on a daily basis?  Switch to skim or 1% milk instead of whole milk.  Focus on lean meats such as ground [place], fresh fish, baked chicken, and lean cuts of beef as your source of dietary protein.  Consume less than 300mg /day of dietary cholesterol.  Limit trans fatty acid consumption primarily by limiting synthetic trans fats such as partially hydrogenated oils (Ex: fried fast foods).  Focus efforts on reducing your intake of "solid" fats (Ex: Butter).  Substitute olive or vegetable oil for solid fats where possible.  Moderation of Salt Intake: Provided you don't carry a diagnosis of congestive heart failure nor renal failure, I recommend a daily allowance of no more than 2300 mg of salt (sodium).  Keeping under this daily goal is associated with a decreased risk of cardiovascular events, creeping above it can lead to elevated blood pressures and increases your risk of cardiovascular events.  Milligrams (mg) of salt is listed on all nutrition labels, and your daily intake can add up faster than you think.  Most canned and frozen dinners can pack in over half your daily salt allowance in one meal.    Lifestyle Health Risks: Certain lifestyle choices carry specific health risks.  As you may already know, tobacco use has been associated with increasing one's risk of cardiovascular disease, pulmonary disease, numerous cancers, among many other issues.  What you may not know is that there are medications and nicotine replacement strategies that can more  than double your chances of successfully quitting.  I would be thrilled to help manage your quitting strategy if you currently use tobacco products.  When it comes to alcohol use, I've yet to find an "ideal" daily allowance.  Provided an individual does not have a medical condition that is exacerbated by alcohol consumption, general guidelines determine "safe drinking" as no more than two standard drinks for a man or no more than one standard drink for a male per day.  However, much debate still exists on whether any amount of alcohol consumption is technically "safe".  My general advice, keep alcohol consumption to a minimum for general health promotion.  If you or others believe that alcohol, tobacco, or recreational drug use is interfering with your life, I would be happy to provide confidential counseling regarding treatment options.  General "Over The Counter" Nutrition Advice: Postmenopausal women should aim for a daily calcium intake of 1200 mg, however a significant portion of this might already be provided by diets including milk, yogurt, cheese, and other dairy products.  Vitamin D has been shown to help preserve bone density, prevent fatigue, and has even been shown to help reduce falls in the elderly.  Ensuring a daily intake of 800 Units of Vitamin D is a good place to start to enjoy the above benefits, we can easily check your Vitamin D level to see if you'd potentially benefit from supplementation beyond 800 Units a day.  Folic  Acid intake should be of particular concern to women of childbearing age.  Daily consumption of 400-800 mcg of Folic Acid is recommended to minimize the chance of spinal cord defects in a fetus should pregnancy occur.    For many adults, accidents still remain one of the most common culprits when it comes to cause of death.  Some of the simplest but most effective preventitive habits you can adopt include regular seatbelt use, proper helmet use, securing firearms, and  regularly testing your smoke and carbon monoxide detectors.  Lennex Pietila B. [facility] DO [facility] [place] [address] [place], [place] [address] Phone: [phone]

## 2013-12-18 LAB — LIPID PANEL
CHOL/HDL RATIO: 3.6 ratio
CHOLESTEROL: 155 mg/dL (ref 0–200)
HDL: 43 mg/dL (ref >39)
LDL Cholesterol: 93 mg/dL (ref 0–99)
Triglycerides: 94 mg/dL (ref <150)
VLDL: 19 mg/dL (ref 0–40)

## 2013-12-18 LAB — BASIC METABOLIC PANEL WITH GFR
BUN: 12 mg/dL (ref 6–23)
CALCIUM: 9.5 mg/dL (ref 8.4–10.5)
CO2: 29 meq/L (ref 19–32)
Chloride: 103 mEq/L (ref 96–112)
Creat: 0.85 mg/dL (ref 0.50–1.35)
GFR, Est Non African American: >89 mL/min
GLUCOSE: 99 mg/dL (ref 70–99)
POTASSIUM: 4.5 meq/L (ref 3.5–5.3)
Sodium: 139 mEq/L (ref 135–145)

## 2014-09-10 ENCOUNTER — Encounter: Payer: Self-pay | Admitting: Family Medicine

## 2014-09-10 ENCOUNTER — Ambulatory Visit (INDEPENDENT_AMBULATORY_CARE_PROVIDER_SITE_OTHER): Payer: BC Managed Care – PPO | Admitting: Family Medicine

## 2014-09-10 VITALS — BP 133/87 | HR 78 | Wt 307.0 lb

## 2014-09-10 DIAGNOSIS — R42 Dizziness and giddiness: Secondary | ICD-10-CM

## 2014-09-10 DIAGNOSIS — K2100 Gastro-esophageal reflux disease with esophagitis, without bleeding: Secondary | ICD-10-CM

## 2014-09-10 DIAGNOSIS — R030 Elevated blood-pressure reading, without diagnosis of hypertension: Secondary | ICD-10-CM

## 2014-09-10 DIAGNOSIS — IMO0001 Reserved for inherently not codable concepts without codable children: Secondary | ICD-10-CM

## 2014-09-10 LAB — POCT GLYCOSYLATED HEMOGLOBIN (HGB A1C): Hemoglobin A1C: 4.8

## 2014-09-10 MED ORDER — PANTOPRAZOLE SODIUM 40 MG PO TBEC: 40.0000 mg | DELAYED_RELEASE_TABLET | Freq: Every day | ORAL | Status: DC

## 2014-09-14 ENCOUNTER — Encounter: Payer: Self-pay | Admitting: Family Medicine

## 2014-09-14 NOTE — Progress Notes (Signed)
CC: Mason MeckelDavid Nichols is a 33 y.o. male is here for Hypertension   Subjective: HPI:  Complains of brief episodic dizziness that occurs during anxious situations. This is worse at his job or while studying for school. It is never occurring when he is relaxed and resting. He's worried that this is a reflection of him possibly having diabetes. He specifically requesting A1c to be checked today. No outside blood sugars to report. Denies polyuria or polyphagia or polydipsia. Denies any other motor or sensory disturbance other than that described above  Epigastric discomfort that is nonradiating that occurs when he is hungry or over 4 hours after his last meal. It is resolved for about an hour by taking Tums. He's been using this treatment on a daily basis for at least a month now. Symptoms are mild-to-moderate in severity have not been getting worse or better since onset. He's wanted to know there something prescription strength he can take. There's been no diarrhea constipation nausea nor vomiting  History of elevated blood pressure: His wife's concern today that he may be developing true essential hypertension and would like his blood pressure checked today. Denies chest pain orthopnea nor peripheral edema.   Review Of Systems Outlined In HPI  Past Medical History  Diagnosis Date  . Sialadenitis (History of) 11/02/2012  . Obesity 11/02/2012    Past Surgical History  Procedure Laterality Date  . Knee surgery  2003    bilateral  . Salivary gland removal  2006    L  . Wisdom tooth extraction     Family History  Problem Relation Age of Onset  . Stroke Father     History   Social History  . Marital Status: Married    Spouse Name: N/A    Number of Children: N/A  . Years of Education: N/A   Occupational History  . Not on file.   Social History Main Topics  . Smoking status: Former Smoker    Quit date: 10/17/2011  . Smokeless tobacco: Never Used  . Alcohol Use: Yes  . Drug Use: No  .  Sexual Activity: Yes   Other Topics Concern  . Not on file   Social History Narrative     Objective: BP 133/87 mmHg  Pulse 78  Wt 307 lb (139.254 kg)  General: Alert and Oriented, No Acute Distress HEENT: Pupils equal, round, reactive to light. Conjunctivae clear.  Moist membranes pharynx unremarkable Lungs: Clear to auscultation bilaterally, no wheezing/ronchi/rales.  Comfortable work of breathing. Good air movement. Cardiac: Regular rate and rhythm. Normal S1/S2.  No murmurs, rubs, nor gallops.   Abdomen: Obese and soft Extremities: No peripheral edema.  Strong peripheral pulses.  Mental Status: No depression, anxiety, nor agitation. Skin: Warm and dry.  Assessment & Plan: Mason Nichols was seen today for hypertension.  Diagnoses and associated orders for this visit:  Dizziness and giddiness - POCT HgB A1C  Gastroesophageal reflux disease with esophagitis - pantoprazole (PROTONIX) 40 MG tablet; Take 1 tablet (40 mg total) by mouth daily.  Elevated blood pressure    Dizziness: Suspect this is due to anxiety and nothing more serious, his A1c today was 4.8 reassurance provided he does not have diabetes. Counseling regarding nutrition and obesity was provided. GERD: Begin taking Protonix for uncontrolled symptoms. Elevated blood pressure: Pre-hypertensive today, discussed the DASH diet and reducing sodium intake. He has a wide room for improvement with regards to reducing sodium   Return if symptoms worsen or fail to improve.

## 2014-09-18 ENCOUNTER — Telehealth: Payer: Self-pay | Admitting: Family Medicine

## 2014-09-18 NOTE — Telephone Encounter (Signed)
Wife called.  Her husband would like to get started on Bp meds.

## 2014-09-22 MED ORDER — LISINOPRIL 20 MG PO TABS: ORAL_TABLET | ORAL | Status: DC

## 2014-09-22 NOTE — Telephone Encounter (Signed)
Will you please let patient know that an Rx for lisinopril was sent to his pharmacy today.  Return in one month to go over BP.

## 2014-09-23 NOTE — Telephone Encounter (Signed)
Message left on vm 

## 2014-12-05 ENCOUNTER — Ambulatory Visit: Payer: Self-pay | Admitting: Physician Assistant

## 2014-12-26 ENCOUNTER — Encounter: Payer: Self-pay | Admitting: Family Medicine

## 2014-12-26 ENCOUNTER — Ambulatory Visit (INDEPENDENT_AMBULATORY_CARE_PROVIDER_SITE_OTHER): Payer: BLUE CROSS/BLUE SHIELD | Admitting: Family Medicine

## 2014-12-26 VITALS — BP 131/76 | HR 82 | Wt 311.0 lb

## 2014-12-26 DIAGNOSIS — I10 Essential (primary) hypertension: Secondary | ICD-10-CM | POA: Diagnosis not present

## 2014-12-26 DIAGNOSIS — K2100 Gastro-esophageal reflux disease with esophagitis, without bleeding: Secondary | ICD-10-CM

## 2014-12-26 DIAGNOSIS — B079 Viral wart, unspecified: Secondary | ICD-10-CM | POA: Diagnosis not present

## 2014-12-26 MED ORDER — PANTOPRAZOLE SODIUM 40 MG PO TBEC: 40.0000 mg | DELAYED_RELEASE_TABLET | Freq: Every day | ORAL | Status: DC

## 2014-12-26 MED ORDER — LISINOPRIL 20 MG PO TABS: ORAL_TABLET | ORAL | Status: DC

## 2014-12-26 NOTE — Progress Notes (Signed)
CC: Mason MeckelDavid Sawtell is a 34 y.o. male is here for Hypertension   Subjective: HPI:   No outside blood pressures report. Continues to take lisinopril daily basis without known side effects. No angioedema or cough. No chest pain shortness of breath orthopnea nor peripheral edema  Follow-up GERD: Continues to take Protonix however he admits it's only taking most days of the week he frequently misses a dose due to forgetfulness. He tells me that he still doesn't have any epigastric discomfort or subjective reflux but his wife has noticed that he is no longer snoring since taking this medication. Denies any known side effects  Complains of growths on the face and one on the right lower chin that is painful due to shaving with frequent bleeding. One on the right cheek just lateral to the nose that is in his field of vision when looking down and reading. Additional lesion on the left side of the face which is painful due to shaving and frequently bleeds. These have been present for at least a few years do not seem to be getting bigger or smaller and nothing makes them better other than avoiding shaving. Denies skin changes elsewhere   Review Of Systems Outlined In HPI  Past Medical History  Diagnosis Date  . Sialadenitis (History of) 11/02/2012  . Obesity 11/02/2012    Past Surgical History  Procedure Laterality Date  . Knee surgery  2003    bilateral  . Salivary gland removal  2006    L  . Wisdom tooth extraction     Family History  Problem Relation Age of Onset  . Stroke Father     History   Social History  . Marital Status: Married    Spouse Name: N/A  . Number of Children: N/A  . Years of Education: N/A   Occupational History  . Not on file.   Social History Main Topics  . Smoking status: Former Smoker    Quit date: 10/17/2011  . Smokeless tobacco: Never Used  . Alcohol Use: Yes  . Drug Use: No  . Sexual Activity: Yes   Other Topics Concern  . Not on file   Social  History Narrative     Objective: BP 131/76 mmHg  Pulse 82  Wt 311 lb (141.069 kg)  General: Alert and Oriented, No Acute Distress HEENT: Pupils equal, round, reactive to light. Conjunctivae clear.  Moist mucous membranes Lungs: Clear to auscultation bilaterally, no wheezing/ronchi/rales.  Comfortable work of breathing. Good air movement. Cardiac: Regular rate and rhythm. Normal S1/S2.  No murmurs, rubs, nor gallops.   Extremities: No peripheral edema.  Strong peripheral pulses.  Mental Status: No depression, anxiety, nor agitation. Skin: Warm and dry. Inflamed wart on the right cheek, left cheek and a noninflamed wart below the right eye lateral to the nose  Assessment & Plan: Onalee HuaDavid was seen today for hypertension.  Diagnoses and all orders for this visit:  Essential hypertension Orders: -     lisinopril (PRINIVIL,ZESTRIL) 20 MG tablet; One tablet by mouth daily for blood pressure control.  Gastroesophageal reflux disease with esophagitis Orders: -     pantoprazole (PROTONIX) 40 MG tablet; Take 1 tablet (40 mg total) by mouth daily.  Warts  essential hypertension: Controlled continue lisinopril GERD: Controlled continue Protonix Warts: Given pain bleeding and interfering with field of vision all 3 warts were treated with cryotherapy for destruction today.  Cryotherapy Procedure Note  Pre-operative Diagnosis: wart  Post-operative Diagnosis: wart  Locations: right cheek left cheek  and just below the right eye  Indications: pain bleeding and impairing field of vision  Anesthesia: none  Procedure Details  History of allergy to iodine: no. Pacemaker? no.  Patient informed of risks (permanent scarring, infection, light or dark discoloration, bleeding, infection, weakness, numbness and recurrence of the lesion) and benefits of the procedure and verbal informed consent obtained.  The 3 areas are treated with liquid nitrogen therapy, frozen until ice ball extended 2 mm  beyond lesion, allowed to thaw, and treated again. The patient tolerated procedure well.  The patient was instructed on post-op care, warned that there may be blister formation, redness and pain. Recommend OTC analgesia as needed for pain.  Condition: Stable  Complications: none.  Plan: 1. Instructed to keep the area dry and covered for 24-48h and clean thereafter. 2. Warning signs of infection were reviewed.   3. Recommended that the patient use OTC analgesics as needed for pain.  4. Return PRN   Return in about 3 months (around 03/28/2015).

## 2015-01-01 ENCOUNTER — Ambulatory Visit (INDEPENDENT_AMBULATORY_CARE_PROVIDER_SITE_OTHER): Payer: BLUE CROSS/BLUE SHIELD | Admitting: Family Medicine

## 2015-01-01 ENCOUNTER — Encounter: Payer: Self-pay | Admitting: Family Medicine

## 2015-01-01 VITALS — BP 125/82 | HR 78 | Ht 73.0 in | Wt 306.0 lb

## 2015-01-01 DIAGNOSIS — R635 Abnormal weight gain: Secondary | ICD-10-CM

## 2015-01-01 DIAGNOSIS — Z0000 Encounter for general adult medical examination without abnormal findings: Secondary | ICD-10-CM | POA: Diagnosis not present

## 2015-01-01 LAB — COMPLETE METABOLIC PANEL WITH GFR
ALK PHOS: 46 U/L (ref 39–117)
ALT: 35 U/L (ref 0–53)
AST: 18 U/L (ref 0–37)
Albumin: 4.1 g/dL (ref 3.5–5.2)
BILIRUBIN TOTAL: 0.8 mg/dL (ref 0.2–1.2)
BUN: 11 mg/dL (ref 6–23)
CO2: 28 meq/L (ref 19–32)
Calcium: 9.4 mg/dL (ref 8.4–10.5)
Chloride: 103 mEq/L (ref 96–112)
Creat: 0.93 mg/dL (ref 0.50–1.35)
Glucose, Bld: 95 mg/dL (ref 70–99)
Potassium: 4.5 mEq/L (ref 3.5–5.3)
Sodium: 138 mEq/L (ref 135–145)
TOTAL PROTEIN: 6.8 g/dL (ref 6.0–8.3)

## 2015-01-01 LAB — CBC
HCT: 45.7 % (ref 39.0–52.0)
HEMOGLOBIN: 15.4 g/dL (ref 13.0–17.0)
MCH: 31.3 pg (ref 26.0–34.0)
MCHC: 33.7 g/dL (ref 30.0–36.0)
MCV: 92.9 fL (ref 78.0–100.0)
MPV: 10 fL (ref 8.6–12.4)
Platelets: 247 10*3/uL (ref 150–400)
RBC: 4.92 MIL/uL (ref 4.22–5.81)
RDW: 12 % (ref 11.5–15.5)
WBC: 8.4 10*3/uL (ref 4.0–10.5)

## 2015-01-01 LAB — LIPID PANEL
CHOL/HDL RATIO: 4.5 ratio
Cholesterol: 175 mg/dL (ref 0–200)
HDL: 39 mg/dL — ABNORMAL LOW (ref >=40)
LDL CALC: 124 mg/dL — AB (ref 0–99)
TRIGLYCERIDES: 59 mg/dL (ref <150)
VLDL: 12 mg/dL (ref 0–40)

## 2015-01-01 MED ORDER — PHENTERMINE HCL 30 MG PO CAPS: 30.0000 mg | ORAL_CAPSULE | ORAL | Status: DC

## 2015-01-01 NOTE — Progress Notes (Signed)
CC: Mason Nichols is a 34 y.o. male is here for Annual Exam   Subjective: HPI:  Colonoscopy: No family history of colon cancer   will begin screening at age 50 Prostate: Discussed screening risks/beneifts with pduring today's visit, no family history of prostate cancer will begin screening at age 70  Influenza Vaccine:  Declined Pneumovax:  No current medication Td/Tdap: Td 2012 up-to-date Zoster: (Start 35 yo)  Requesting complete physical exam. Rare alcohol use no tobacco or recreational drug use. He is having difficulty with gaining weight he has wife of identified that the biggest her lowest portion control. He loves to eat and also eat as a therapy for relieving stress. No formal exercise routine, realistically he doesn't have time for this.   Review of Systems - General ROS: negative for - chills, fever, night sweats, weight loss Ophthalmic ROS: negative for - decreased vision Psychological ROS: negative for - anxiety or depression ENT ROS: negative for - hearing change, nasal congestion, tinnitus or allergies Hematological and Lymphatic ROS: negative for - bleeding problems, bruising or swollen lymph nodes Breast ROS: negative Respiratory ROS: no cough, shortness of breath, or wheezing Cardiovascular ROS: no chest pain or dyspnea on exertion Gastrointestinal ROS: no abdominal pain, change in bowel habits, or black or bloody stools Genito-Urinary ROS: negative for - genital discharge, genital ulcers, incontinence or abnormal bleeding from genitals Musculoskeletal ROS: negative for - joint pain or muscle pain Neurological ROS: negative for - headaches or memory loss Dermatological ROS: negative for lumps, mole changes, rash and skin lesion changes  Past Medical History  Diagnosis Date  . Sialadenitis (History of) 11/02/2012  . Obesity 11/02/2012    Past Surgical History  Procedure Laterality Date  . Knee surgery  2003    bilateral  . Salivary gland removal  2006    L  .  Wisdom tooth extraction     Family History  Problem Relation Age of Onset  . Stroke Father     History   Social History  . Marital Status: Married    Spouse Name: N/A  . Number of Children: N/A  . Years of Education: N/A   Occupational History  . Not on file.   Social History Main Topics  . Smoking status: Former Smoker    Quit date: 10/17/2011  . Smokeless tobacco: Never Used  . Alcohol Use: Yes  . Drug Use: No  . Sexual Activity: Yes   Other Topics Concern  . Not on file   Social History Narrative     Objective: BP 125/82 mmHg  Pulse 78  Ht  (1.854 m)  Wt 306 lb (138.801 kg)  BMI 40.38 kg/m2  General: No Acute Distress HEENT: Atraumatic, normocephalic, conjunctivae normal without scleral icterus.  No nasal discharge, hearing grossly intact, TMs with good landmarks bilaterally with no middle ear abnormalities, posterior pharynx clear without oral lesions. Neck: Supple, trachea midline, no cervical nor supraclavicular adenopathy. Pulmonary: Clear to auscultation bilaterally without wheezing, rhonchi, nor rales. Cardiac: Regular rate and rhythm.  No murmurs, rubs, nor gallops. No peripheral edema.  2+ peripheral pulses bilaterally. Abdomen: Bowel sounds normal.  No masses.  Non-tender without rebound.  Negative Murphy's sign. GU: Bilateral descended testes no inguinal hernia  MSK: Grossly intact, no signs of weakness.  Full strength throughout upper and lower extremities.  Full ROM in upper and lower extremities.  No midline spinal tenderness. Neuro: Gait unremarkable, CN II-XII grossly intact.  C5-C6 Reflex 2/4 Bilaterally, L4 Reflex 2/4 Bilaterally.  Cerebellar function intact. Skin: No rashes. Psych: Alert and oriented to person/place/time.  Thought process normal. No anxiety/depression.  Assessment & Plan: Onalee HuaDavid was seen today for annual exam.  Diagnoses and all orders for this visit:  Annual physical exam Orders: -     Lipid Profile -     CBC -      COMPLETE METABOLIC PANEL WITH GFR  Abnormal weight gain Orders: -     phentermine 30 MG capsule; Take 1 capsule (30 mg total) by mouth every morning.  Healthy lifestyle interventions including but not limited to regular exercise, a healthy low fat diet, moderation of salt intake, the dangers of tobacco/alcohol/recreational drug use, nutrition supplementation, and accident avoidance were discussed with the patient and a handout was provided for future reference.   discussed risks and benefits of phentermine and that it may cause his blood pressure to go up requiring us to stop the medication. Also discussed that this is not a long-term solution for weight loss. If he is considering refills will need to return for blood pressure and weight checks every 4 weeks.  Return in about 4 weeks (around 01/29/2015) for Blood pressure weight check.

## 2015-01-02 ENCOUNTER — Encounter: Payer: Self-pay | Admitting: Family Medicine

## 2015-01-08 ENCOUNTER — Telehealth: Payer: Self-pay | Admitting: Family Medicine

## 2015-01-08 MED ORDER — AZITHROMYCIN 250 MG PO TABS: ORAL_TABLET | ORAL | Status: AC

## 2015-01-08 NOTE — Telephone Encounter (Signed)
Cough getting worse on a daily basis, productive.

## 2015-03-09 ENCOUNTER — Other Ambulatory Visit: Payer: Self-pay | Admitting: Family Medicine

## 2015-04-02 ENCOUNTER — Other Ambulatory Visit: Payer: Self-pay | Admitting: *Deleted

## 2015-04-02 DIAGNOSIS — K2100 Gastro-esophageal reflux disease with esophagitis, without bleeding: Secondary | ICD-10-CM

## 2015-04-02 MED ORDER — LISINOPRIL 20 MG PO TABS: ORAL_TABLET | ORAL | Status: DC

## 2015-04-02 MED ORDER — PANTOPRAZOLE SODIUM 40 MG PO TBEC: 40.0000 mg | DELAYED_RELEASE_TABLET | Freq: Every day | ORAL | Status: DC

## 2015-08-18 ENCOUNTER — Other Ambulatory Visit: Payer: Self-pay | Admitting: Family Medicine

## 2015-09-25 ENCOUNTER — Other Ambulatory Visit: Payer: Self-pay | Admitting: Family Medicine

## 2015-11-13 ENCOUNTER — Encounter: Payer: Self-pay | Admitting: Family Medicine

## 2015-11-13 ENCOUNTER — Ambulatory Visit (INDEPENDENT_AMBULATORY_CARE_PROVIDER_SITE_OTHER): Payer: BLUE CROSS/BLUE SHIELD | Admitting: Family Medicine

## 2015-11-13 VITALS — BP 129/77 | HR 71 | Wt 321.0 lb

## 2015-11-13 DIAGNOSIS — Z0000 Encounter for general adult medical examination without abnormal findings: Secondary | ICD-10-CM

## 2015-11-13 DIAGNOSIS — I10 Essential (primary) hypertension: Secondary | ICD-10-CM | POA: Diagnosis not present

## 2015-11-13 DIAGNOSIS — E785 Hyperlipidemia, unspecified: Secondary | ICD-10-CM

## 2015-11-13 LAB — CBC
HEMATOCRIT: 46.2 % (ref 39.0–52.0)
Hemoglobin: 15.7 g/dL (ref 13.0–17.0)
MCH: 31.2 pg (ref 26.0–34.0)
MCHC: 34 g/dL (ref 30.0–36.0)
MCV: 91.7 fL (ref 78.0–100.0)
MPV: 10 fL (ref 8.6–12.4)
Platelets: 288 10*3/uL (ref 150–400)
RBC: 5.04 MIL/uL (ref 4.22–5.81)
RDW: 12.3 % (ref 11.5–15.5)
WBC: 9.9 10*3/uL (ref 4.0–10.5)

## 2015-11-13 LAB — LIPID PANEL
CHOLESTEROL: 168 mg/dL (ref 125–200)
HDL: 37 mg/dL — ABNORMAL LOW (ref >=40)
LDL Cholesterol: 114 mg/dL (ref <130)
Total CHOL/HDL Ratio: 4.5 Ratio (ref <=5.0)
Triglycerides: 87 mg/dL (ref <150)
VLDL: 17 mg/dL (ref <30)

## 2015-11-13 LAB — COMPLETE METABOLIC PANEL WITH GFR
ALT: 33 U/L (ref 9–46)
AST: 18 U/L (ref 10–40)
Albumin: 4 g/dL (ref 3.6–5.1)
Alkaline Phosphatase: 46 U/L (ref 40–115)
BUN: 9 mg/dL (ref 7–25)
CALCIUM: 9.2 mg/dL (ref 8.6–10.3)
CHLORIDE: 102 mmol/L (ref 98–110)
CO2: 27 mmol/L (ref 20–31)
CREATININE: 0.9 mg/dL (ref 0.60–1.35)
GFR, Est African American: >89 mL/min (ref >=60)
GFR, Est Non African American: >89 mL/min (ref >=60)
GLUCOSE: 90 mg/dL (ref 65–99)
Potassium: 4.4 mmol/L (ref 3.5–5.3)
Sodium: 138 mmol/L (ref 135–146)
Total Bilirubin: 0.6 mg/dL (ref 0.2–1.2)
Total Protein: 7.1 g/dL (ref 6.1–8.1)

## 2015-11-13 MED ORDER — LISINOPRIL 20 MG PO TABS: ORAL_TABLET | ORAL | Status: DC

## 2015-11-13 MED ORDER — PANTOPRAZOLE SODIUM 40 MG PO TBEC: 40.0000 mg | DELAYED_RELEASE_TABLET | Freq: Every day | ORAL | Status: DC

## 2015-11-13 NOTE — Progress Notes (Signed)
CC: Mason Nichols is a 35 y.o. male is here for Annual Exam   Subjective:  HPI:  Colonoscopy: No curre  Prostate: Discussed screening risks/beneifts with patient on ____. No PSA/DRE vs old recs of ---> (Start 36 yo, Start 40-45 if AA/Fam Hx/BRCA1-2, PSA +/- DRE 2-4 years)  Influenza Vaccine: declined  Pneumovax: no current indication  Td/Tdap: UTD from 2012  Zoster: (Start 35 yo)  Review Of Systems Outlined In HPI  Past Medical History   Diagnosis  Date   .  Sialadenitis (History of)  11/02/2012   .  Obesity  11/02/2012    Past Surgical History   Procedure  Laterality  Date   .  Knee surgery   2003     bilateral   .  Salivary gland removal   2006     L   .  Wisdom tooth extraction      Family History   Problem  Relation  Age of Onset   .  Stroke  Father     Social History    Social History   .  Marital Status:  Married     Spouse Name:  N/A   .  Number of Children:  N/A   .  Years of Education:  N/A    Occupational History   .  Not on file.    Social History Main Topics   .  Smoking status:  Former Smoker     Quit date:  10/17/2011   .  Smokeless tobacco:  Never Used   .  Alcohol Use:  Yes   .  Drug Use:  No   .  Sexual Activity:  Yes    Other Topics  Concern   .  Not on file    Social History Narrative    Objective:  BP 129/77 mmHg  Pulse 71  Wt 321 lb (145.605 kg)  General: No Acute Distress  HEENT: Atraumatic, normocephalic, conjunctivae normal without scleral icterus. No nasal discharge, hearing grossly intact, TMs with good landmarks bilaterally with no middle ear abnormalities, posterior pharynx clear without oral lesions.  Neck: Supple, trachea midline, no cervical nor supraclavicular adenopathy.  Pulmonary: Clear to auscultation bilaterally without wheezing, rhonchi, nor rales.  Cardiac: Regular rate and rhythm. No murmurs, rubs, nor gallops. No peripheral edema. 2+ peripheral pulses bilaterally.  Abdomen: Bowel sounds normal. No masses. Non-tender  without rebound. Negative Murphy's sign.  MSK: Grossly intact, no signs of weakness. Full strength throughout upper and lower extremities. Full ROM in upper and lower extremities. No midline spinal tenderness.  Neuro: Gait unremarkable, CN II-XII grossly intact. C5-C6 Reflex 2/4 Bilaterally, L4 Reflex 2/4 Bilaterally. Cerebellar function intact.  Skin: No rashes.  Psych: Alert and oriented to person/place/time. Thought process normal. No anxiety/depression.  Assessment & Plan:  Mason Nichols was seen today for annual exam.  Diagnoses and all orders for this visit:  Hyperlipidemia with target LDL less than 160  Essential hypertension  Annual physical exam  - Lipid panel  - COMPLETE METABOLIC PANEL WITH GFR  - CBC  Other orders  - pantoprazole (PROTONIX) 40 MG tablet; Take 1 tablet (40 mg total) by mouth daily.  - lisinopril (PRINIVIL,ZESTRIL) 20 MG tablet; TAKE 1 TABLET DAILY FOR BLOOD PRESSURE CONTROL   Healthy lifestyle interventions including but not limited to regular exercise, a healthy low fat diet, moderation of salt intake, the dangers of tobacco/alcohol/recreational drug use, nutrition supplementation, and accident avoidance were discussed with the patient and a handout was provided  for future reference.  Return in about 1 year (around 11/12/2016).

## 2016-02-08 ENCOUNTER — Encounter: Payer: Self-pay | Admitting: Family Medicine

## 2016-02-08 ENCOUNTER — Ambulatory Visit (INDEPENDENT_AMBULATORY_CARE_PROVIDER_SITE_OTHER): Payer: BLUE CROSS/BLUE SHIELD | Admitting: Family Medicine

## 2016-02-08 VITALS — BP 133/74 | HR 71 | Wt 305.0 lb

## 2016-02-08 DIAGNOSIS — M545 Low back pain, unspecified: Secondary | ICD-10-CM | POA: Insufficient documentation

## 2016-02-08 MED ORDER — METHOCARBAMOL 500 MG PO TABS: 500.0000 mg | ORAL_TABLET | Freq: Three times a day (TID) | ORAL | Status: DC

## 2016-02-08 MED ORDER — NAPROXEN 500 MG PO TABS: 500.0000 mg | ORAL_TABLET | Freq: Two times a day (BID) | ORAL | Status: DC

## 2016-02-08 NOTE — Patient Instructions (Signed)
Thank you for coming in today. Come back or go to the emergency room if you notice new weakness new numbness problems walking or bowel or bladder problems. Attend PT.  Try naproxen instead of ibuoprofen.  Take robaxin mostly at night.  Use a TENS unit.   TENS UNIT: This is helpful for muscle pain and spasm.   Search and Purchase a TENS 7000 2nd edition at www.tenspros.com. It should be less than $30.     TENS unit instructions: Do not shower or bathe with the unit on Turn the unit off before removing electrodes or batteries If the electrodes lose stickiness add a drop of water to the electrodes after they are disconnected from the unit and place on plastic sheet. If you continued to have difficulty, call the TENS unit company to purchase more electrodes. Do not apply lotion on the skin area prior to use. Make sure the skin is clean and dry as this will help prolong the life of the electrodes. After use, always check skin for unusual red areas, rash or other skin difficulties. If there are any skin problems, does not apply electrodes to the same area. Never remove the electrodes from the unit by pulling the wires. Do not use the TENS unit or electrodes other than as directed. Do not change electrode placement without consultating your therapist or physician. Keep 2 fingers with between each electrode. Wear time ratio is 2:1, on to off times.    For example on for 30 minutes off for 15 minutes and then on for 30 minutes off for 15 minutes   Lumbosacral Strain Lumbosacral strain is a strain of any of the parts that make up your lumbosacral vertebrae. Your lumbosacral vertebrae are the bones that make up the lower third of your backbone. Your lumbosacral vertebrae are held together by muscles and tough, fibrous tissue (ligaments).  CAUSES  A sudden blow to your back can cause lumbosacral strain. Also, anything that causes an excessive stretch of the muscles in the low back can cause this  strain. This is typically seen when people exert themselves strenuously, fall, lift heavy objects, bend, or crouch repeatedly. RISK FACTORS  Physically demanding work.  Participation in pushing or pulling sports or sports that require a sudden twist of the back (tennis, golf, baseball).  Weight lifting.  Excessive lower back curvature.  Forward-tilted pelvis.  Weak back or abdominal muscles or both.  Tight hamstrings. SIGNS AND SYMPTOMS  Lumbosacral strain may cause pain in the area of your injury or pain that moves (radiates) down your leg.  DIAGNOSIS Your health care provider can often diagnose lumbosacral strain through a physical exam. In some cases, you may need tests such as X-ray exams.  TREATMENT  Treatment for your lower back injury depends on many factors that your clinician will have to evaluate. However, most treatment will include the use of anti-inflammatory medicines. HOME CARE INSTRUCTIONS   Avoid hard physical activities (tennis, racquetball, waterskiing) if you are not in proper physical condition for it. This may aggravate or create problems.  If you have a back problem, avoid sports requiring sudden body movements. Swimming and walking are generally safer activities.  Maintain good posture.  Maintain a healthy weight.  For acute conditions, you may put ice on the injured area.  Put ice in a plastic bag.  Place a towel between your skin and the bag.  Leave the ice on for 20 minutes, 2-3 times a day.  When the low back starts  healing, stretching and strengthening exercises may be recommended. SEEK MEDICAL CARE IF:  Your back pain is getting worse.  You experience severe back pain not relieved with medicines. SEEK IMMEDIATE MEDICAL CARE IF:   You have numbness, tingling, weakness, or problems with the use of your arms or legs.  There is a change in bowel or bladder control.  You have increasing pain in any area of the body, including your belly  (abdomen).  You notice shortness of breath, dizziness, or feel faint.  You feel sick to your stomach (nauseous), are throwing up (vomiting), or become sweaty.  You notice discoloration of your toes or legs, or your feet get very cold. MAKE SURE YOU:   Understand these instructions.  Will watch your condition.  Will get help right away if you are not doing well or get worse.   This information is not intended to replace advice given to you by your health care provider. Make sure you discuss any questions you have with your health care provider.   Document Released: 07/13/2005 Document Revised: 10/24/2014 Document Reviewed: 05/22/2013 Elsevier Interactive Patient Education [employer]

## 2016-02-08 NOTE — Progress Notes (Signed)
   Subjective:    I'm seeing this patient as a consultation for:  Dr. Ivan AnchorsHommel  CC: Right back pain  HPI: Patient is a two-week history of moderate to severe right-sided low back pain. He notes pain radiates to the posterior thigh. He denies any pain radiating the on the leg. No weakness or numbness bowel bladder dysfunction. He has tried ibuprofen and rest which have not helped. Pain is worse with sitting and better with standing. He denies any specific injury.  Past medical history, Surgical history, Family history not pertinant except as noted below, Social history, Allergies, and medications have been entered into the medical record, reviewed, and no changes needed.   Review of Systems: No headache, visual changes, nausea, vomiting, diarrhea, constipation, dizziness, abdominal pain, skin rash, fevers, chills, night sweats, weight loss, swollen lymph nodes, body aches, joint swelling, muscle aches, chest pain, shortness of breath, mood changes, visual or auditory hallucinations.   Objective:    Filed Vitals:   02/08/16 1146  BP: 133/74  Pulse: 71   General: Well Developed, well nourished, and in no acute distress.  Obese Neuro/Psych: Alert and oriented x3, extra-ocular muscles intact, able to move all 4 extremities, sensation grossly intact. Skin: Warm and dry, no rashes noted.  Respiratory: Not using accessory muscles, speaking in full sentences, trachea midline.  Cardiovascular: Pulses palpable, no extremity edema. Abdomen: Does not appear distended. MSK: Back: Nontender to spinal midline. Tender palpation right SI joint. Back motion is normal however patient has pain with flexion. Negative slump test. Normal reflexes and sensation bilateral lower extremities. Lower extremity strength is equal and normal throughout. Normal gait.  No results found for this or any previous visit (from the past 24 hour(s)). No results found.  Impression and Recommendations:   35 year old male with  lumbosacral strain. Patient also has SI joint pain. Plan for physical therapy naproxen Robaxin TENS unit and heating pad. Recheck in about 2 weeks if not better.  This case required medical decision making of moderate complexity.

## 2016-02-18 ENCOUNTER — Encounter: Payer: Self-pay | Admitting: Rehabilitative and Restorative Service Providers"

## 2016-02-18 ENCOUNTER — Ambulatory Visit (INDEPENDENT_AMBULATORY_CARE_PROVIDER_SITE_OTHER): Payer: BLUE CROSS/BLUE SHIELD | Admitting: Rehabilitative and Restorative Service Providers"

## 2016-02-18 DIAGNOSIS — M5416 Radiculopathy, lumbar region: Secondary | ICD-10-CM | POA: Diagnosis not present

## 2016-02-18 DIAGNOSIS — R29898 Other symptoms and signs involving the musculoskeletal system: Secondary | ICD-10-CM | POA: Diagnosis not present

## 2016-02-18 NOTE — Therapy (Addendum)
[facility] [place], [place], [address] Phone: [phone]   Fax:  [phone]  Physical Therapy Evaluation  Patient Details  Name: Mason Nichols MRN: [id number] Date of Birth: [birth year] Referring Provider: Dr. Lynne Leader  Encounter Date: 02/18/2016      PT End of Session - 02/18/16 1253    Visit Number 1   Number of Visits 12   Date for PT Re-Evaluation 03/31/16   PT Start Time 1150   PT Stop Time 1254   PT Time Calculation (min) 64 min   Activity Tolerance Patient tolerated treatment well      Past Medical History  Diagnosis Date  . Sialadenitis (History of) 11/02/2012  . Obesity 11/02/2012    Past Surgical History  Procedure Laterality Date  . Knee surgery  2003    bilateral  . Salivary gland removal  2006    L  . Wisdom tooth extraction      There were no vitals filed for this visit.       Subjective Assessment - 02/18/16 1146    Subjective Patient reports that he injured his back and started having pain in the Rt LB with pain into the Rt hip and down his thigh. He has used the TENS unit and done some stretching but symptoms have not improved much.    Pertinent History denies previous LBP; bilat knee surgery early 2000's (patellar tendon I & D)    How long can you sit comfortably? 2 hours    How long can you stand comfortably? 20-30 min    How long can you walk comfortably? no limit    Diagnostic tests none   Patient Stated Goals back not to hurt    Currently in Pain? Yes   Pain Score 6    Pain Location Back   Pain Orientation Right   Pain Descriptors / Indicators Shooting   Pain Type Acute pain   Pain Radiating Towards Rt hip into the Rt buttocks - shooting catching pain into the center of thigh not below the knee    Pain Onset 1 to 4 weeks ago   Pain Frequency Intermittent   Aggravating Factors  sitting or standing; difficulty sleeping(sleeps back to side) - when symptoms are  present activities like dressing and shoes are difficult    Pain Relieving Factors changing positions; physical activity; TENS unit temporarily;             [facility] PT Assessment - 02/18/16 0001    Assessment   Medical Diagnosis Rt LBP Rt LE pain    Referring Provider Dr. Lynne Leader   Onset Date/Surgical Date 01/30/16   Hand Dominance Right   Next MD Visit 6/17   Prior Therapy none   Precautions   Precautions None   Balance Screen   Has the patient fallen in the past 6 months No   Has the patient had a decrease in activity level because of a fear of falling?  No   Is the patient reluctant to leave their home because of a fear of falling?  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 household chores   Observation/Other Assessments   Focus on Therapeutic Outcomes (FOTO)  41% limitation    Sensation   Additional Comments WFL's per pt report    Posture/Postural Control   Posture Comments head forward; shoulders rounded and elevated;  increased thoracic kyphosis; incresaed lumbar lordosis    AROM   Lumbar Flexion 70%   Lumbar Extension 45%   Lumbar - Right Side Bend 75%  some pinching Rt side    Lumbar - Left Side Bend 70%   Lumbar - Right Rotation 45%   Lumbar - Left Rotation 40%  pinch Lt side lumbar musculature    Strength   Overall Strength Comments 5/5 bilat LE's    Flexibility   Hamstrings Rt 40 deg; Lt 50 deg    Quadriceps tight Rt 90 deg with pain into the Rt LB; Lt tightness at 95 deg    ITB tight bilat    Piriformis tight bilat Rt > Lt    Palpation   Spinal mobility pain with CPA mobs L5/S1; UPA mobs Rt > Lt  L4/5/S1   Palpation comment tight Rt piriformis/hip abductors; Rt lumbar paraspinals/QL                    [facility] Adult PT Treatment/Exercise - 02/18/16 0001    Therapeutic Activites    Therapeutic Activities --  myofacial ball release Rt piriformis/hip abductors    Lumbar Exercises: Stretches   Passive Hamstring Stretch 3 reps;30 seconds   Prone on Elbows Stretch 2 reps;60 seconds   Press Ups --  2 sec x 10    Piriformis Stretch 3 reps;30 seconds   Lumbar Exercises: Supine   Ab Set --  3 part core 10 sec x 10    Moist Heat Therapy   Number Minutes Moist Heat 15 Minutes   Moist Heat Location Lumbar Spine;Hip  Rt   Electrical Stimulation   Electrical Stimulation Location bilat L5; Rt piriformis    Electrical Stimulation Action IFC   Electrical Stimulation Parameters to tolerance   Electrical Stimulation Goals Pain;Tone                PT Education - 02/18/16 1239    Education provided Yes   Education Details HEP; back care; myofacial release    Person(s) Educated Patient   Methods Explanation;Demonstration;Tactile cues;Verbal cues;Handout   Comprehension Verbalized understanding;Returned demonstration;Verbal cues required;Tactile cues required             PT Long Term Goals - 02/18/16 1258    PT LONG TERM GOAL #1   Title Patient to demonstrate and report transfers and proper positions for spine care 03/31/16   Time 6   Period Weeks   Status New   PT LONG TERM GOAL #2   Title Improve ROM through trunk and LE's to WFL's throughout 03/31/16   Time 6   Period Weeks   Status New   PT LONG TERM GOAL #3   Title Decrese pain by 50-75% for 90% of day per pt report 03/31/16   Time 6   Period Weeks   PT LONG TERM GOAL #4   Title Independent in HEP including safe gym program  03/31/16   Time 6   Period Weeks   Status New   PT LONG TERM GOAL #5   Title Improve FOTO to </= 25% limitation 03/31/16   Time 6   Period Weeks   Status New  Plan - 02/18/16 1254    Clinical Impression Statement Patient presents with LBP and Rt LE radicular pain consistent with lumbar radiculopathy. He also has tightness through the Rt piriformis and hip abductors. Patient has limited trunk and LE ROM and limited functional activity level. He will benefit form PT to address problems identified.    Rehab Potential Good   PT Frequency 2x / week   PT Duration 6 weeks   PT Treatment/Interventions Patient/family education;ADLs/Self Care Home Management;Manual techniques;Dry needling;Cryotherapy;Electrical Stimulation;Iontophoresis 72m/ml Dexamethasone;Moist Heat;Traction;Ultrasound;Neuromuscular re-education;Therapeutic activities;Therapeutic exercise   PT Next Visit Plan core stabilization; mobs for lumbar spine; pec stretches; possible trial of lmbar traction; extension program as tolerated avoiding rotation and flexion; continue with spine care education; modalities as indicated.    PT Home Exercise Plan HEP; myofacail release    Consulted and Agree with Plan of Care Patient      Patient will benefit from skilled therapeutic intervention in order to improve the following deficits and impairments:  Postural dysfunction, Improper body mechanics, Pain, Decreased range of motion, Decreased mobility, Increased fascial restricitons, Decreased endurance, Decreased activity tolerance  Visit Diagnosis: Lumbar radiculopathy - Plan: PT plan of care cert/re-cert  Other symptoms and signs involving the musculoskeletal system - Plan: PT plan of care cert/re-cert     Problem List Patient Active Problem List   Diagnosis Date Noted  . Lumbago 02/08/2016  . Essential hypertension 12/26/2014  . Hyperlipidemia with target LDL less than 160 12/10/2012  . History of depression 11/08/2012  . Obesity 11/02/2012  . Sialadenitis (History of) 11/02/2012    Brookes Craine PNilda SimmerPT, MPH  02/18/2016, 1:15 PM  [facility] [address] [place] [place] [address]Phone: [phone]  Fax:  [phone] Name: Mason PinedaMRN: [id number] of Birth: 5Nov 16, 1982  PHYSICAL THERAPY DISCHARGE SUMMARY  Visits from Start of Care: Evaluation only   Current functional level related to goals / functional outcomes: unchanged   Remaining deficits: unchanged   Education / Equipment: Initial HEP  Plan: Patient agrees to discharge.  Patient goals were not met. Patient is being discharged due to not returning since the last visit.  ?????    Rodel Glaspy P. HHelene KelpPT, MPH 04/20/2016 12:50 PM

## 2016-02-18 NOTE — Patient Instructions (Signed)
Abdominal Bracing With Pelvic Floor (Hook-Lying)    With neutral spine, tighten pelvic floor and abdominals sucking belly button to back bone and tighten the muscles in the low back at the waist. hold 10 sec . Repeat __10_ times. Do _several __ times a day. Progress to sitting; standing; walking and with functional activity.   Elbow Prop (Extension)    Prop body up on elbows for _60 ___ seconds. Slowly lower it. Repeat __2-3__ times. Do _as needed during the per day.  Trunk: Prone Extension (Press-Ups)    Lie on stomach on firm, flat surface. Relax bottom and legs. Raise chest in air with elbows straight. Keep hips flat on surface, sag stomach. Hold __2-3__ seconds. Repeat __10__ times. Do _several ___ sessions per day. CAUTION: Movement should be gentle and slow.  HIP: Hamstrings - Supine   Can bend opposite knee for back stability  Place strap around foot. Raise leg up, keep knee straight. Hold _30__ seconds. 3_ reps per set, _2-3__ sets per day   Piriformis Stretch    Lying on back, pull right knee toward opposite shoulder. Hold __30__ seconds. Repeat __3__ times. Do __2-3__ sessions per day.  Ball massage with ~ 4 inch rubber ball for Rt hip    Sleeping on Back  Place pillow under knees. A pillow with cervical support and a roll around waist are also helpful. Copyright  VHI. All rights reserved.  Sleeping on Side Place pillow between knees. Use cervical support under neck and a roll around waist as needed. Copyright  VHI. All rights reserved.   Sleeping on Stomach   If this is the only desirable sleeping position, place pillow under lower legs, and under stomach or chest as needed.  Posture - Sitting   Sit upright, head facing forward. Try using a roll to support lower back. Keep shoulders relaxed, and avoid rounded back. Keep hips level with knees. Avoid crossing legs for long periods. Stand to Sit / Sit to Stand   To sit: Bend knees to lower self onto  front edge of chair, then scoot back on seat. To stand: Reverse sequence by placing one foot forward, and scoot to front of seat. Use rocking motion to stand up.   Work Height and Reach  Ideal work height is no more than 2 to 4 inches below elbow level when standing, and at elbow level when sitting. Reaching should be limited to arm's length, with elbows slightly bent.  Bending  Bend at hips and knees, not back. Keep feet shoulder-width apart.    Posture - Standing   Good posture is important. Avoid slouching and forward head thrust. Maintain curve in low back and align ears over shoul- ders, hips over ankles.  Alternating Positions   Alternate tasks and change positions frequently to reduce fatigue and muscle tension. Take rest breaks. Computer Work   Position work to Art gallery managerface forward. Use proper work and seat height. Keep shoulders back and down, wrists straight, and elbows at right angles. Use chair that provides full back support. Add footrest and lumbar roll as needed.  Getting Into / Out of Car  Lower self onto seat, scoot back, then bring in one leg at a time. Reverse sequence to get out.  Dressing  Lie on back to pull socks or slacks over feet, or sit and bend leg while keeping back straight.    Housework - Sink  Place one foot on ledge of cabinet under sink when standing at sink for prolonged periods.  Pushing / Pulling  Pushing is preferable to pulling. Keep back in proper alignment, and use leg muscles to do the work.  Deep Squat   Squat and lift with both arms held against upper trunk. Tighten stomach muscles without holding breath. Use smooth movements to avoid jerking.  Avoid Twisting   Avoid twisting or bending back. Pivot around using foot movements, and bend at knees if needed when reaching for articles.  Carrying Luggage   Distribute weight evenly on both sides. Use a cart whenever possible. Do not twist trunk. Move body as a unit.   Lifting  Principles .Maintain proper posture and head alignment. .Slide object as close as possible before lifting. .Move obstacles out of the way. .Test before lifting; ask for help if too heavy. .Tighten stomach muscles without holding breath. .Use smooth movements; do not jerk. .Use legs to do the work, and pivot with feet. .Distribute the work load symmetrically and close to the center of trunk. .Push instead of pull whenever possible.   Ask For Help   Ask for help and delegate to others when possible. Coordinate your movements when lifting together, and maintain the low back curve.  Log Roll   Lying on back, bend left knee and place left arm across chest. Roll all in one movement to the right. Reverse to roll to the left. Always move as one unit. Housework - Sweeping  Use long-handled equipment to avoid stooping.   Housework - Wiping  Position yourself as close as possible to reach work surface. Avoid straining your back.  Laundry - Unloading Wash   To unload small items at bottom of washer, lift leg opposite to arm being used to reach.  Gardening - Raking  Move close to area to be raked. Use arm movements to do the work. Keep back straight and avoid twisting.     Cart  When reaching into cart with one arm, lift opposite leg to keep back straight.   Getting Into / Out of Bed  Lower self to lie down on one side by raising legs and lowering head at the same time. Use arms to assist moving without twisting. Bend both knees to roll onto back if desired. To sit up, start from lying on side, and use same move-ments in reverse. Housework - Vacuuming  Hold the vacuum with arm held at side. Step back and forth to move it, keeping head up. Avoid twisting.   Laundry - Armed forces training and education officer so that bending and twisting can be avoided.   Laundry - Unloading Dryer  Squat down to reach into clothes dryer or use a reacher.  Gardening - Weeding /  Psychiatric nurse or Kneel. Knee pads may be helpful.                     Marland Kitchen

## 2016-03-02 ENCOUNTER — Telehealth: Payer: Self-pay

## 2016-03-02 ENCOUNTER — Ambulatory Visit (INDEPENDENT_AMBULATORY_CARE_PROVIDER_SITE_OTHER): Payer: BLUE CROSS/BLUE SHIELD | Admitting: Physician Assistant

## 2016-03-02 ENCOUNTER — Encounter: Payer: Self-pay | Admitting: Physician Assistant

## 2016-03-02 VITALS — BP 132/70 | HR 77 | Temp 98.5°F | Wt 292.0 lb

## 2016-03-02 DIAGNOSIS — R059 Cough, unspecified: Secondary | ICD-10-CM

## 2016-03-02 DIAGNOSIS — R05 Cough: Secondary | ICD-10-CM | POA: Diagnosis not present

## 2016-03-02 DIAGNOSIS — Z8701 Personal history of pneumonia (recurrent): Secondary | ICD-10-CM | POA: Diagnosis not present

## 2016-03-02 DIAGNOSIS — J069 Acute upper respiratory infection, unspecified: Secondary | ICD-10-CM | POA: Diagnosis not present

## 2016-03-02 MED ORDER — HYDROCODONE-HOMATROPINE 5-1.5 MG/5ML PO SYRP: 5.0000 mL | ORAL_SOLUTION | Freq: Every evening | ORAL | Status: DC | PRN

## 2016-03-02 MED ORDER — AZITHROMYCIN 250 MG PO TABS: ORAL_TABLET | ORAL | Status: DC

## 2016-03-02 MED ORDER — PREDNISONE 20 MG PO TABS: ORAL_TABLET | ORAL | Status: DC

## 2016-03-02 MED ORDER — CEFTRIAXONE SODIUM 1 G IJ SOLR
250.0000 mg | Freq: Once | INTRAMUSCULAR | Status: AC
  Administered 2016-03-02: 250 mg via INTRAMUSCULAR

## 2016-03-02 NOTE — Progress Notes (Signed)
   Subjective:    Patient ID: Mason MeckelDavid Liska, male    DOB: 01/17/1981, 35 y.o.   MRN: [id number]  HPI Pt is a 35 yo male who presents to the clinic with cough, sinus drainage, ear pain, ST for 2 days. He has a hx of pneumonia. His son was diagnosed with CAP last week and given rocephin, levaquin and hycodan. He is in school and has exams this week. He wants something that works fast. No wheezing. Taking mucinex. No fever, chills, n/v/d.    Review of Systems  All other systems reviewed and are negative.      Objective:   Physical Exam  Constitutional: He is oriented to person, place, and time. He appears well-developed and well-nourished.  HENT:  Head: Normocephalic and atraumatic.  Right Ear: External ear normal.  Left Ear: External ear normal.  Nose: Nose normal.  Mouth/Throat: Oropharynx is clear and moist. No oropharyngeal exudate.  TM's erythematous.  +light reflex.  Bilateral tenderness over maxillary sinuses to palpation.    Eyes: Conjunctivae are normal. Right eye exhibits no discharge. Left eye exhibits no discharge.  Neck: Normal range of motion. Neck supple.  Cardiovascular: Normal rate, regular rhythm and normal heart sounds.   Pulmonary/Chest: Effort normal and breath sounds normal. He has no wheezes.  Lymphadenopathy:    He has cervical adenopathy.  Neurological: He is alert and oriented to person, place, and time.  Skin: Skin is dry.  Psychiatric: He has a normal mood and affect. His behavior is normal.          Assessment & Plan:  URI/cough- due to hx of pneumonia and personal contact will treat with zpak. Rocephin IM given in office today. Prednisone burst only if needed. Hycodan for cough. Per pt he has tolerated hycodan in the past. Continue mucinex and flonase. Rest and hydrate. Follow up as needed.

## 2016-03-02 NOTE — Telephone Encounter (Signed)
Patient is seeing Lesly RubensteinJade today.

## 2016-03-02 NOTE — Telephone Encounter (Signed)
He should probably get an appointment to be seen, I offered to call in a different antibiotic for her daughter when seeing [name] but this would not apply for Mason Nichols since he's not been seen for this condition yet.

## 2016-03-02 NOTE — Telephone Encounter (Signed)
Mason HuaDavid is coughing and has a fever for 2 days. He has the came symptoms as [name]. Wife was advised to call back if he gets sick. Please advise.

## 2016-03-04 ENCOUNTER — Telehealth: Payer: Self-pay | Admitting: Physician Assistant

## 2016-03-04 MED ORDER — LEVOFLOXACIN 500 MG PO TABS: 500.0000 mg | ORAL_TABLET | Freq: Every day | ORAL | Status: DC

## 2016-03-04 NOTE — Telephone Encounter (Signed)
Rx sent. Wife notified of new Rx and to stop zpac (Pt had not completed it). Pt has not started the Prednisone, will start today. Pt using cough syrup at night. No further questions.

## 2016-03-04 NOTE — Telephone Encounter (Signed)
Pt's wife, Herbert SetaHeather, was advised to contact clinic today if Pt was not feeling better. Wife reports Pt is now coughing all the time and wheezing. Will route to treating Provider. Would like new Rx sent to [facility] on [address].

## 2016-03-04 NOTE — Telephone Encounter (Signed)
Did he start prednisone that was given to him if he started wheezing?  He has the hycodan for cough at bedtime right?  Stop zpak if not finished. Please go ahead and send levaquin 500mg  I po qd for 7 days. #7 nRF.

## 2016-08-19 ENCOUNTER — Encounter: Payer: Self-pay | Admitting: Sports Medicine

## 2016-08-19 ENCOUNTER — Ambulatory Visit (INDEPENDENT_AMBULATORY_CARE_PROVIDER_SITE_OTHER): Payer: BLUE CROSS/BLUE SHIELD | Admitting: Sports Medicine

## 2016-08-19 ENCOUNTER — Ambulatory Visit (INDEPENDENT_AMBULATORY_CARE_PROVIDER_SITE_OTHER): Payer: BLUE CROSS/BLUE SHIELD

## 2016-08-19 DIAGNOSIS — M25561 Pain in right knee: Secondary | ICD-10-CM | POA: Diagnosis not present

## 2016-08-19 DIAGNOSIS — M170 Bilateral primary osteoarthritis of knee: Secondary | ICD-10-CM

## 2016-08-19 DIAGNOSIS — M25562 Pain in left knee: Secondary | ICD-10-CM

## 2016-08-19 DIAGNOSIS — G8929 Other chronic pain: Secondary | ICD-10-CM

## 2016-08-19 DIAGNOSIS — M25462 Effusion, left knee: Secondary | ICD-10-CM | POA: Diagnosis not present

## 2016-08-19 MED ORDER — MELOXICAM 15 MG PO TABS: ORAL_TABLET | ORAL | 3 refills | Status: DC

## 2016-08-19 NOTE — Progress Notes (Signed)
Subjective:    I'm seeing this patient as a consultation for:  Dr. 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 He had a history of Osgood-Schlatter disease, and is post knee arthroscopy, it's not clear exactly what was done. Unfortunately he has had recurrent and chronic pain localized at the medial joint line as well as under the kneecap of both knees, moderate, persistent without radiation, occasional catching and grinding but no locking or buckling. He has tried bupropion and Aleve without much improvement. Has not seen a physician for his knees and some time.  Past medical history:  Negative.  See flowsheet/record as well for more information.  Surgical history: Negative.  See flowsheet/record as well for more information.  Family history: Negative.  See flowsheet/record as well for more information.  Social history: Negative.  See flowsheet/record as well for more information.  Allergies, and medications have been entered into the medical record, reviewed, and no changes needed.   Review of Systems: No headache, visual changes, nausea, vomiting, diarrhea, constipation, dizziness, abdominal pain, skin rash, fevers, chills, night sweats, weight loss, swollen lymph nodes, body aches, joint swelling, muscle aches, chest pain, shortness of breath, mood changes, visual or auditory hallucinations.   Objective:   General: Well Developed, well nourished, and in no acute distress.  Neuro/Psych: Alert and oriented x3, extra-ocular muscles intact, able to move all 4 extremities, sensation grossly intact. Skin: Warm and dry, no rashes noted.  Respiratory: Not using accessory muscles, speaking in full sentences, trachea midline.  Cardiovascular: Pulses palpable, no extremity edema. Abdomen: Does not appear distended. Bilateral knees: Visibly swollen, right worse than left with a fluid wave, and tenderness at the patellar facets as well as the  medial joint lines. ROM normal in flexion and extension and lower leg rotation. Ligaments with solid consistent endpoints including ACL, PCL, LCL, MCL. Negative Mcmurray's and provocative meniscal tests. Non painful patellar compression. Patellar and quadriceps tendons unremarkable. Hamstring and quadriceps strength is normal.  Procedure: Real-time Ultrasound Guided aspiration/Injection of right knee Device: GE Logiq E  Verbal informed consent obtained.  Time-out conducted.  Noted no overlying erythema, induration, or other signs of local infection.  Skin prepped in a sterile fashion.  Local anesthesia: Topical Ethyl chloride.  With sterile technique and under real time ultrasound guidance:  Aspirated 10 mL straw-colored fluid, syringe switched and 1 mL kenalog 40, 2 mL lidocaine, 2 mL Marcaine injected easily. Completed without difficulty  Pain immediately resolved suggesting accurate placement of the medication.  Advised to call if fevers/chills, erythema, induration, drainage, or persistent bleeding.  Images permanently stored and available for review in the ultrasound unit.  Impression: Technically successful ultrasound guided injection.  Procedure: Real-time Ultrasound Guided Injection of left knee Device: GE Logiq E  Verbal informed consent obtained.  Time-out conducted.  Noted no overlying erythema, induration, or other signs of local infection.  Skin prepped in a sterile fashion.  Local anesthesia: Topical Ethyl chloride.  With sterile technique and under real time ultrasound guidance:  1 mL Kenalog 40, 2 mL lidocaine, 2 mL Marcaine injected easily. Completed without difficulty  Pain immediately resolved suggesting accurate placement of the medication.  Advised to call if fevers/chills, erythema, induration, drainage, or persistent bleeding.  Images permanently stored and available for review in the ultrasound unit.  Impression: Technically successful ultrasound guided  injection.  Impression and Recommendations:   This case required medical decision making of moderate complexity.  Primary  osteoarthritis of both knees Bilateral aspiration and injection. Meloxicam. X-rays. Formal physical therapy, return in one month, we will work on [employer] supplementation if no better.

## 2016-08-19 NOTE — Assessment & Plan Note (Signed)
Bilateral aspiration and injection. Meloxicam. X-rays. Formal physical therapy, return in one month, we will work on [employer] supplementation if no better.

## 2017-01-10 DIAGNOSIS — H40013 Open angle with borderline findings, low risk, bilateral: Secondary | ICD-10-CM | POA: Diagnosis not present

## 2017-04-11 DIAGNOSIS — H40013 Open angle with borderline findings, low risk, bilateral: Secondary | ICD-10-CM | POA: Diagnosis not present

## 2017-10-03 ENCOUNTER — Encounter: Payer: Self-pay | Admitting: Family Medicine

## 2017-10-03 ENCOUNTER — Ambulatory Visit: Payer: BLUE CROSS/BLUE SHIELD | Admitting: Family Medicine

## 2017-10-03 VITALS — BP 143/97 | HR 64 | Ht 73.0 in | Wt 301.0 lb

## 2017-10-03 DIAGNOSIS — L918 Other hypertrophic disorders of the skin: Secondary | ICD-10-CM | POA: Diagnosis not present

## 2017-10-03 DIAGNOSIS — G4733 Obstructive sleep apnea (adult) (pediatric): Secondary | ICD-10-CM

## 2017-10-03 DIAGNOSIS — E785 Hyperlipidemia, unspecified: Secondary | ICD-10-CM | POA: Diagnosis not present

## 2017-10-03 DIAGNOSIS — Z6839 Body mass index (BMI) 39.0-39.9, adult: Secondary | ICD-10-CM

## 2017-10-03 DIAGNOSIS — I10 Essential (primary) hypertension: Secondary | ICD-10-CM

## 2017-10-03 NOTE — Patient Instructions (Signed)
Thank you for coming in today. The skin tags should blister and fall off.  We can retreat during your physical.  Get fasting labs before the next visit.  Recheck sooner if needed.  Please schedule a physical in January .   Restart CPAP.   Complete a food log before the physical.    Skin Tag, Adult A skin tag (acrochordon) is a soft, extra growth of skin. Most skin tags are flesh-colored and rarely bigger than a pencil eraser. They commonly form near areas where there are folds in the skin, such as the armpit or groin. Skin tags are not dangerous, and they do not spread from person to person (are not contagious). You may have one skin tag or several. Skin tags do not require treatment. However, your health care provider may recommend removal of a skin tag if it:  Gets irritated from clothing.  Bleeds.  Is visible and unsightly.  Your health care provider can remove skin tags with a simple surgical procedure or a procedure that involves freezing the skin tag. Follow these instructions at home:  Watch for any changes in your skin tag. A normal skin tag does not require any other special care at home.  Take over-the-counter and prescription medicines only as told by your health care provider.  Keep all follow-up visits as told by your health care provider. This is important. Contact a health care provider if:  You have a skin tag that: ? Becomes painful. ? Changes color. ? Bleeds. ? Swells.  You develop more skin tags. This information is not intended to replace advice given to you by your health care provider. Make sure you discuss any questions you have with your health care provider. Document Released: 10/18/2015 Document Revised: 05/29/2016 Document Reviewed: 10/18/2015 Elsevier Interactive Patient Education  [employer]2018 Elsevier Inc.

## 2017-10-03 NOTE — Progress Notes (Signed)
Louis MeckelDavid Tidwell is a 36 y.o. male who presents [facility]: Primary Care Sports Medicine today for skin tags on face. Onalee HuaDavid has a medical history significant for skin tags.  He has had liquid nitrogen therapy in the past which helped.  He notes 3 small skin tags on the left face and one larger skin tag at the right jaw.  These are irritated and get in the way of shaving and grooming.  He would like repeat liquid nitrogen therapy of possible.  Hypertension: Onalee HuaDavid has a history of hypertension but is not currently taking his lisinopril.  He blames his weight and would like to work on weight loss.  Sleep apnea: Onalee HuaDavid also has sleep apnea but is not currently using CPAP.   Past Medical History:  Diagnosis Date  . Obesity 11/02/2012  . Sialadenitis (History of) 11/02/2012   Past Surgical History:  Procedure Laterality Date  . KNEE SURGERY  2003   bilateral  . salivary gland removal  2006   L  . WISDOM TOOTH EXTRACTION     Social History   Tobacco Use  . Smoking status: Former Smoker    Last attempt to quit: 10/17/2011    Years since quitting: 5.9  . Smokeless tobacco: Never Used  Substance Use Topics  . Alcohol use: Yes   family history includes Stroke in his father.  ROS as above:  Medications: Current Outpatient Medications  Medication Sig Dispense Refill  . lisinopril (PRINIVIL,ZESTRIL) 20 MG tablet TAKE 1 TABLET DAILY FOR BLOOD PRESSURE CONTROL 90 tablet 3   No current facility-administered medications for this visit.    Allergies  Allergen Reactions  . Hydrocodone     Hives Per patient thinks is codeine.     Health Maintenance Health Maintenance  Topic Date Due  . HIV Screening  02/22/1995  . INFLUENZA VACCINE  05/17/2017  . TETANUS/TDAP  10/17/2020     Exam:  BP (!) 143/97   Pulse 64   Ht 6\' 1"  (1.854 m)   Wt (!) 301 lb (136.5 kg)   BMI 39.71 kg/m  Gen: Well  NAD HEENT: EOMI,  MMM Lungs: Normal work of breathing. CTABL Heart: RRR no MRG Abd: NABS, Soft. Nondistended, Nontender Exts: Brisk capillary refill, warm and well perfused.  Skin: 3 tiny irritated skin tags left face and one larger irritated skin tag right angle of the jaw.  Cryotherapy:  Consent obtained and risks reviewed The 3 small skin tags left face treated with short bursts of liquid nitrogen to achieve Frost for 10 seconds.  This is repeated x3 The one larger skin tag at the right jaw was treated with short bursts of liquid nitrogen to achieve Frost for 10 seconds.  This is repeated x3    No results found for this or any previous visit (from the past 72 hour(s)). No results found.    Assessment and Plan: 36 y.o. male with skin tags: Treated with cryotherapy today.  Plan to recheck in about a month.  Hypertension: Blood pressure elevated.  Onalee HuaDavid would like to focus on weight loss.  Will recheck in about a month for wellness visit.  Obtain basic labs before the next visit.  Hyperlipidemia: We will also check basic fasting labs for the next visit.  Obesity: Central medical problem.  Check fasting labs below.  Work on food log.  Recheck in 1 month.  Sleep apnea: Recommend restarting CPAP   Orders Placed This Encounter  Procedures  .  CBC  . COMPLETE METABOLIC PANEL WITH GFR  . Lipid Panel w/reflex Direct LDL  . TSH   No orders of the defined types were placed in this encounter.    Discussed warning signs or symptoms. Please see discharge instructions. Patient expresses understanding.

## 2017-10-11 DIAGNOSIS — E785 Hyperlipidemia, unspecified: Secondary | ICD-10-CM | POA: Diagnosis not present

## 2017-10-11 DIAGNOSIS — I10 Essential (primary) hypertension: Secondary | ICD-10-CM | POA: Diagnosis not present

## 2017-10-11 LAB — TSH: TSH: 1.17 m[IU]/L (ref 0.40–4.50)

## 2017-10-11 LAB — COMPLETE METABOLIC PANEL WITH GFR
AG RATIO: 1.7 (calc) (ref 1.0–2.5)
ALT: 23 U/L (ref 9–46)
AST: 14 U/L (ref 10–40)
Albumin: 4.3 g/dL (ref 3.6–5.1)
Alkaline phosphatase (APISO): 45 U/L (ref 40–115)
BILIRUBIN TOTAL: 0.7 mg/dL (ref 0.2–1.2)
BUN: 13 mg/dL (ref 7–25)
CALCIUM: 9.2 mg/dL (ref 8.6–10.3)
CHLORIDE: 106 mmol/L (ref 98–110)
CO2: 27 mmol/L (ref 20–32)
Creat: 0.76 mg/dL (ref 0.60–1.35)
GFR, EST AFRICAN AMERICAN: 135 mL/min/{1.73_m2} (ref >=60)
GFR, EST NON AFRICAN AMERICAN: 117 mL/min/{1.73_m2} (ref >=60)
GLOBULIN: 2.6 g/dL (ref 1.9–3.7)
Glucose, Bld: 106 mg/dL — ABNORMAL HIGH (ref 65–99)
POTASSIUM: 4.1 mmol/L (ref 3.5–5.3)
SODIUM: 139 mmol/L (ref 135–146)
TOTAL PROTEIN: 6.9 g/dL (ref 6.1–8.1)

## 2017-10-11 LAB — LIPID PANEL W/REFLEX DIRECT LDL
Cholesterol: 193 mg/dL (ref <200)
HDL: 48 mg/dL (ref >40)
LDL Cholesterol (Calc): 129 mg/dL (calc) — ABNORMAL HIGH
NON-HDL CHOLESTEROL (CALC): 145 mg/dL — AB (ref <130)
Total CHOL/HDL Ratio: 4 (calc) (ref <5.0)
Triglycerides: 72 mg/dL (ref <150)

## 2017-10-11 LAB — CBC
HEMATOCRIT: 45 % (ref 38.5–50.0)
Hemoglobin: 15.5 g/dL (ref 13.2–17.1)
MCH: 30.9 pg (ref 27.0–33.0)
MCHC: 34.4 g/dL (ref 32.0–36.0)
MCV: 89.6 fL (ref 80.0–100.0)
MPV: 10.2 fL (ref 7.5–12.5)
PLATELETS: 241 10*3/uL (ref 140–400)
RBC: 5.02 10*6/uL (ref 4.20–5.80)
RDW: 11.3 % (ref 11.0–15.0)
WBC: 8.7 10*3/uL (ref 3.8–10.8)

## 2017-11-17 ENCOUNTER — Encounter: Payer: Self-pay | Admitting: Family Medicine

## 2017-11-17 ENCOUNTER — Ambulatory Visit (INDEPENDENT_AMBULATORY_CARE_PROVIDER_SITE_OTHER): Payer: BLUE CROSS/BLUE SHIELD | Admitting: Family Medicine

## 2017-11-17 VITALS — BP 131/88 | HR 88 | Ht 73.0 in | Wt 307.0 lb

## 2017-11-17 DIAGNOSIS — Z0000 Encounter for general adult medical examination without abnormal findings: Secondary | ICD-10-CM | POA: Diagnosis not present

## 2017-11-17 NOTE — Progress Notes (Signed)
       Louis MeckelDavid Simonetti is a 37 y.o. male who [facility]: Primary Care [facility] Medicine today for well adult visit.  Onalee HuaDavid presents to clinic today for well visit.  He is feeling pretty well and is not taking any medications nor using his CPAP machine for sleep apnea.  He recognizes obesity is his main medical problem and is eager to work on weight loss strategies to improve both his hypertension and sleep apnea.  He has had success in the past but regained weight.  He denies chest pain palpitations or shortness of breath.   Past Medical History:  Diagnosis Date  . Obesity 11/02/2012  . Sialadenitis (History of) 11/02/2012   Past Surgical History:  Procedure Laterality Date  . KNEE SURGERY  2003   bilateral  . salivary gland removal  2006   L  . WISDOM TOOTH EXTRACTION     Social History   Tobacco Use  . Smoking status: Former Smoker    Last attempt to quit: 10/17/2011    Years since quitting: 6.0  . Smokeless tobacco: Never Used  Substance Use Topics  . Alcohol use: Yes   family history includes Stroke in his father.  ROS as above:  Medications: No current outpatient medications on file.   No current facility-administered medications for this visit.    Allergies  Allergen Reactions  . Hydrocodone     Hives Per patient thinks is codeine.     Health Maintenance Health Maintenance  Topic Date Due  . HIV Screening  02/22/1995  . INFLUENZA VACCINE  07/20/2018 (Originally 05/17/2017)  . TETANUS/TDAP  10/17/2020     Exam:  BP 131/88   Pulse 88   Ht 6\' 1"  (1.854 m)   Wt (!) 307 lb (139.3 kg)   BMI 40.50 kg/m   Wt Readings from Last 5 Encounters:  11/17/17 (!) 307 lb (139.3 kg)  10/03/17 (!) 301 lb (136.5 kg)  08/19/16 275 lb 14.4 oz (125.1 kg)  03/02/16 292 lb (132.5 kg)  02/08/16 (!) 305 lb (138.3 kg)    Gen: Well NAD obese HEENT: EOMI,  MMM Lungs: Normal work of  breathing. CTABL Heart: RRR no MRG Abd: NABS, Soft. Nondistended, Nontender Exts: Brisk capillary refill, warm and well perfused.    No results found for this or any previous visit (from the past 72 hour(s)). No results found.    Assessment and Plan: 37 y.o. male with well adult doing reasonably well.  Obesity hypertension sleep apnea and hyperlipidemia in the main medical problems.  Work on weight loss recheck in 3-6 months.  If not losing weight at that time and would strongly recommend treating hypertension and sleep apnea with blood pressure medications and CPAP.   No orders of the defined types were placed in this encounter.  No orders of the defined types were placed in this encounter.    Discussed warning signs or symptoms. Please see discharge instructions. Patient expresses understanding.

## 2017-11-17 NOTE — Patient Instructions (Signed)
Thank you for coming in today. Recheck weight in 3-6 months.  Goal is to lose weight and improve hypertension, and sleep apnea.   Goal is around 2000 calories a day.

## 2018-06-23 IMAGING — DX DG KNEE COMPLETE 4+V*L*
4 series · 4 of 4 positions shown · non-contrast
Comparison: None.

CLINICAL DATA: Chronic left knee pain. Diffuse knee pain beginning
2 days ago after exercise. Remote left knee surgery.

EXAM:
LEFT KNEE - COMPLETE 4+ VIEW

[knee ap]
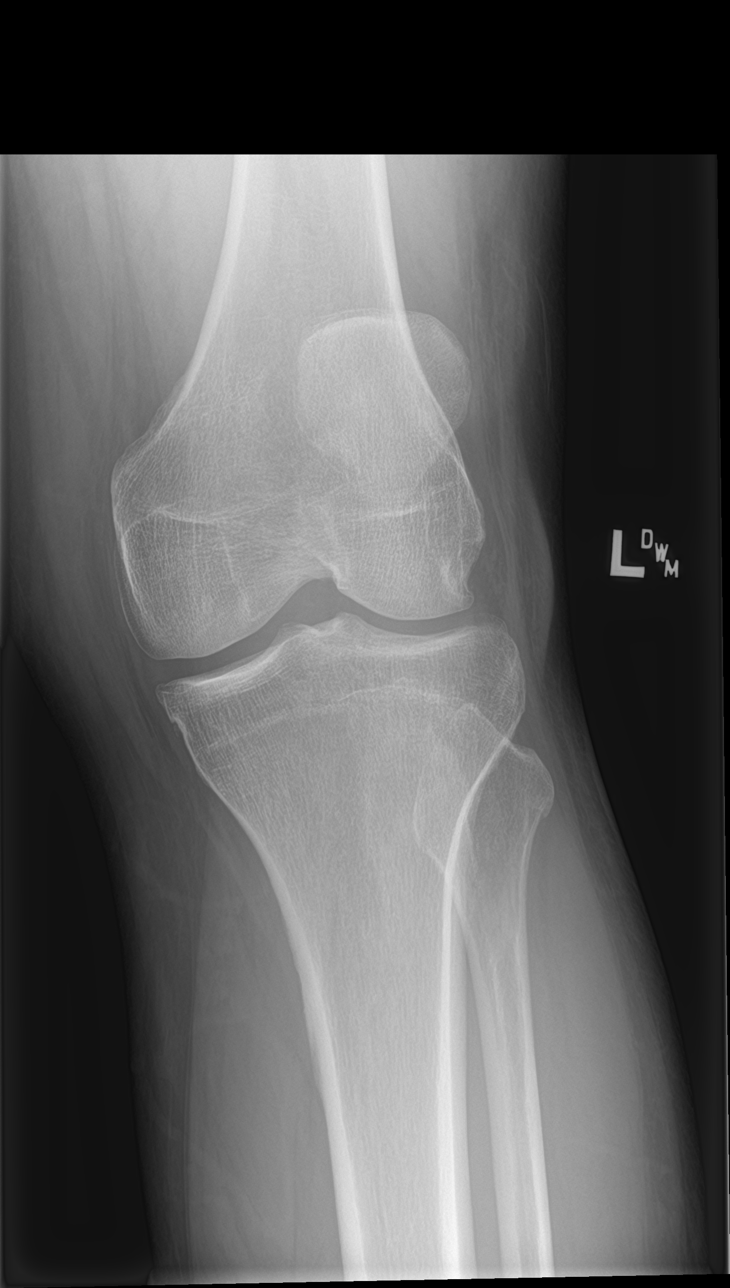

[tunnel]
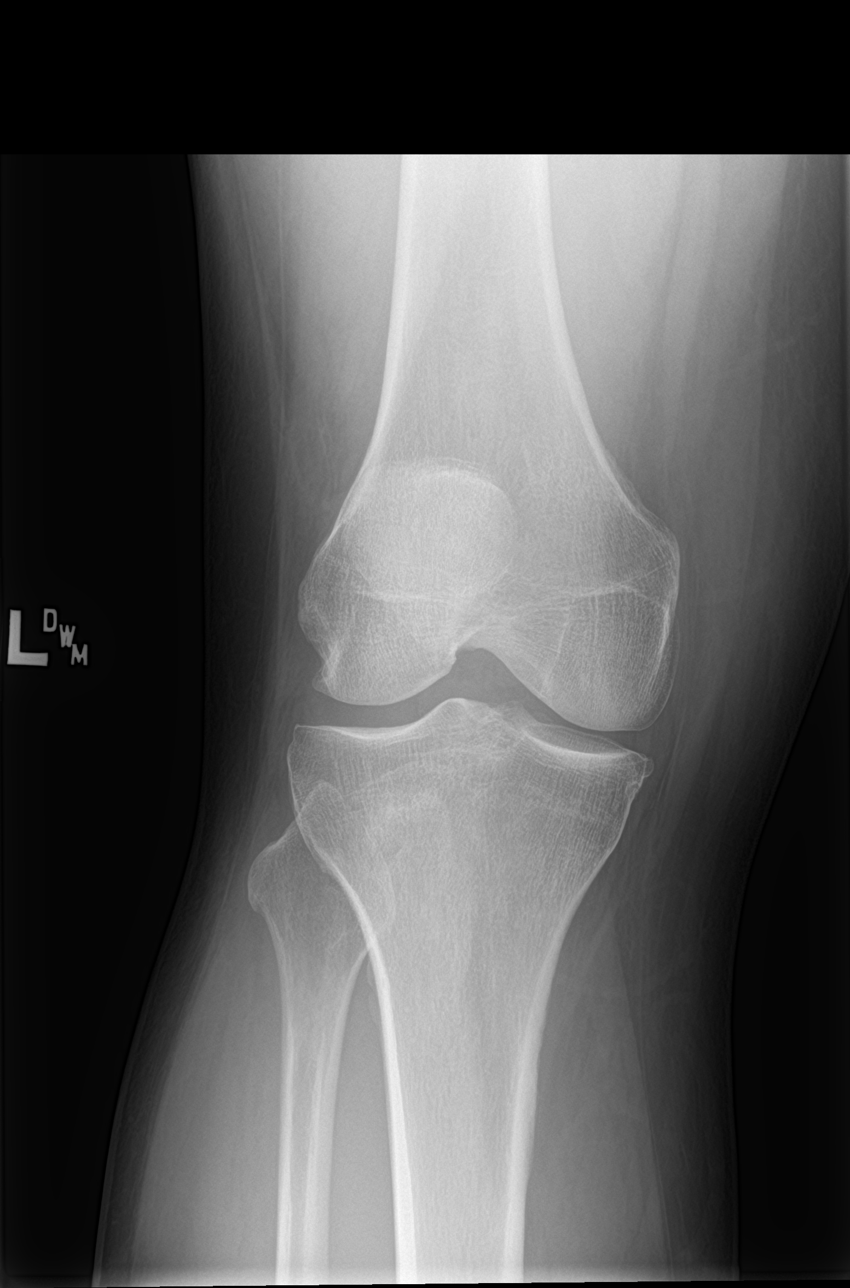

[knee lat]
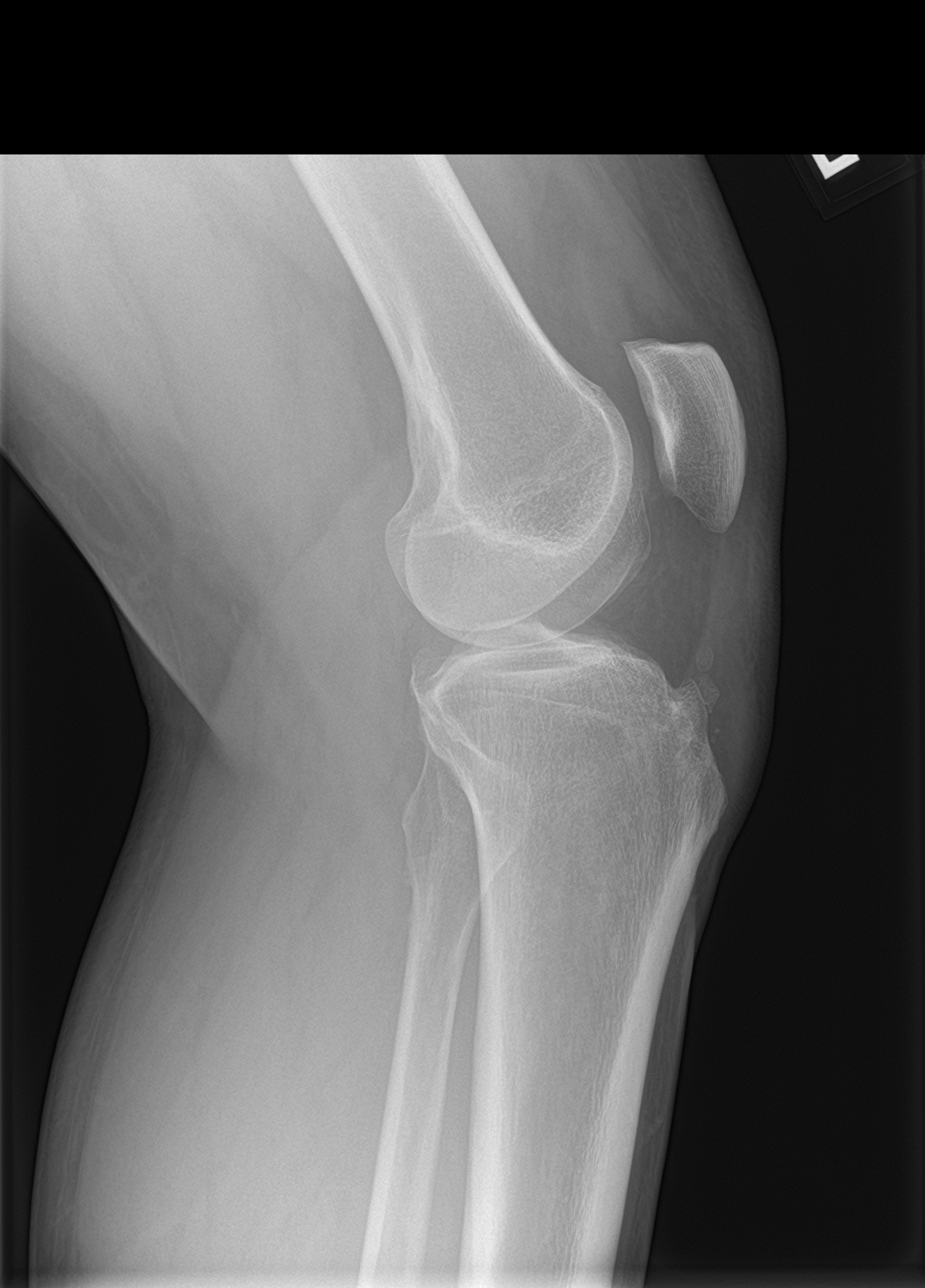

[knee sunrise]
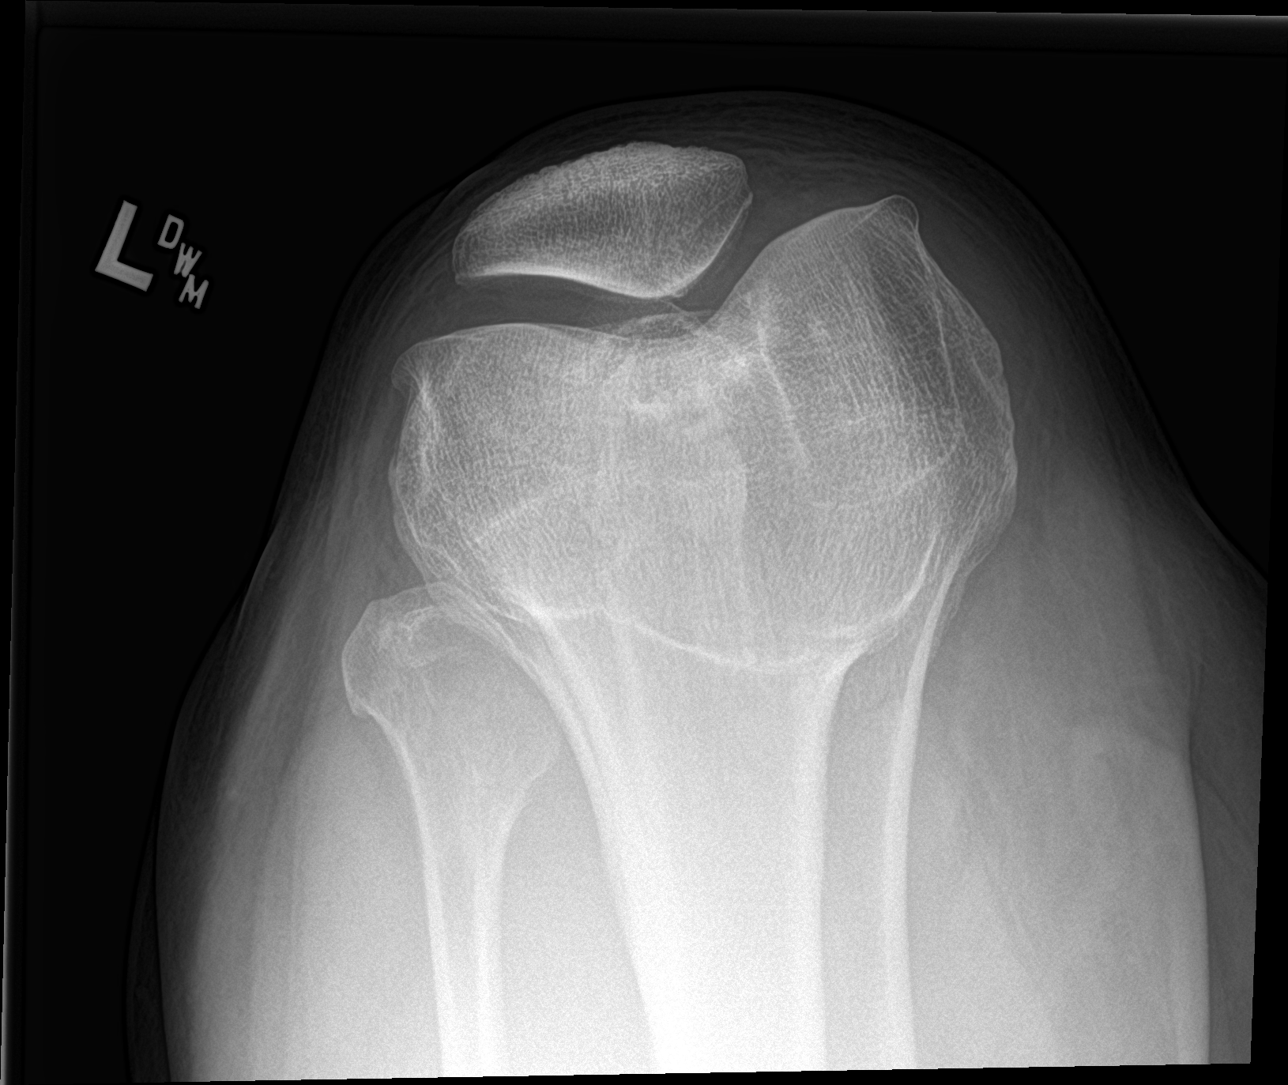

[4 of 4 positions shown; findings below may reference images not displayed]

FINDINGS: There is no evidence of fracture or dislocation. There may be a very
small knee joint effusion. Femorotibial and patellofemoral joint
space widths are preserved without significant arthropathic changes
identified. A few small well corticated ossicles are noted adjacent
to the tibial tuberosity.
IMPRESSION: Possible small knee joint effusion.  No acute osseous abnormality.

## 2020-06-17 ENCOUNTER — Ambulatory Visit (INDEPENDENT_AMBULATORY_CARE_PROVIDER_SITE_OTHER): Payer: BC Managed Care – PPO | Admitting: Sports Medicine

## 2020-06-17 DIAGNOSIS — H938X2 Other specified disorders of left ear: Secondary | ICD-10-CM | POA: Diagnosis not present

## 2020-06-17 NOTE — Assessment & Plan Note (Signed)
Mason Nichols has noted a mass behind his left ear, clinically it is well-defined, movable, approximately 1 cm across and likely an epidermoid cyst. I did explain that this was benign, it is currently not bothering him, and not causing a cosmetic disturbance so we will leave this alone for now. Should this start to bother him, drain, we can do a surgical excision.

## 2020-06-17 NOTE — Progress Notes (Signed)
    Procedures performed today:    None.  Independent interpretation of notes and tests performed by another provider:   None.  Brief History, Exam, Impression, and Recommendations:    Mass of left ear Mason Nichols has noted a mass behind his left ear, clinically it is well-defined, movable, approximately 1 cm across and likely an epidermoid cyst. I did explain that this was benign, it is currently not bothering him, and not causing a cosmetic disturbance so we will leave this alone for now. Should this start to bother him, drain, we can do a surgical excision.    ___________________________________________ Mason Nichols. Benjamin Stain, M.D., ABFM., CAQSM. Primary Care and Sports Medicine [facility]  Adjunct Instructor of Family Medicine  [employer]

## 2020-08-21 ENCOUNTER — Encounter: Payer: Self-pay | Admitting: Family Medicine

## 2020-08-21 ENCOUNTER — Telehealth (INDEPENDENT_AMBULATORY_CARE_PROVIDER_SITE_OTHER): Payer: BC Managed Care – PPO | Admitting: Family Medicine

## 2020-08-21 DIAGNOSIS — J189 Pneumonia, unspecified organism: Secondary | ICD-10-CM

## 2020-08-21 MED ORDER — AZITHROMYCIN 250 MG PO TABS: ORAL_TABLET | ORAL | 0 refills | Status: AC

## 2020-08-21 MED ORDER — AMOXICILLIN-POT CLAVULANATE 875-125 MG PO TABS: 1.0000 | ORAL_TABLET | Freq: Two times a day (BID) | ORAL | 0 refills | Status: AC

## 2020-08-21 MED ORDER — ALBUTEROL SULFATE (2.5 MG/3ML) 0.083% IN NEBU: 2.5000 mg | INHALATION_SOLUTION | Freq: Four times a day (QID) | RESPIRATORY_TRACT | 0 refills | Status: AC | PRN

## 2020-08-21 NOTE — Progress Notes (Signed)
Mason Nichols - 39 y.o. male MRN [id number]  Date of birth: 02-05-1981   This visit type was conducted due to national recommendations for restrictions regarding the COVID-19 Pandemic (e.g. social distancing).  This format is felt to be most appropriate for this patient at this time.  All issues noted in this document were discussed and addressed.  No physical exam was performed (except for noted visual exam findings with Video Visits).  I discussed the limitations of evaluation and management by telemedicine and the availability of in person appointments. The patient expressed understanding and agreed to proceed.  I connected with@ on 08/21/20 at 10:30 AM EDT by a video enabled telemedicine application and verified that I am speaking with the correct person using two identifiers.  Present at visit: Mason Coombe, DO Mason Nichols   Patient Location: Home [address] [place] [address]   Provider location:   [facility]  Chief Complaint  Patient presents with  . Possible Pneumonia    HPI  Mason Nichols is a 39 y.o. male who presents via audio/video conferencing for a telehealth visit today.  He has complaint of sore throat, congestion, fever, and cough.  He reports that symptoms started a couple of days ago.  Cough has been productive.  He has had some mild shortness of breath with coughing.  He does have history of pneumonia x2 and current symptoms feel similar.  He denies headache, body aches, nausea, or GI symptoms.  He did take a rapid home COVID test this morning which he reports was negative.    ROS:  A comprehensive ROS was completed and negative except as noted per HPI  Past Medical History:  Diagnosis Date  . Obesity 11/02/2012  . Sialadenitis (History of) 11/02/2012    Past Surgical History:  Procedure Laterality Date  . KNEE SURGERY  2003   bilateral  . salivary gland removal  2006   L  . WISDOM TOOTH EXTRACTION      Family History  Problem Relation Age of  Onset  . Stroke Father     Social History   Socioeconomic History  . Marital status: Married    Spouse name: Not on file  . Number of children: Not on file  . Years of education: Not on file  . Highest education level: Not on file  Occupational History  . Not on file  Tobacco Use  . Smoking status: Former Smoker    Quit date: 10/17/2011    Years since quitting: 8.8  . Smokeless tobacco: Never Used  Substance and Sexual Activity  . Alcohol use: Yes  . Drug use: No  . Sexual activity: Yes  Other Topics Concern  . Not on file  Social History Narrative  . Not on file   Social Determinants of Health   Financial Resource Strain:   . Difficulty of Paying Living Expenses: Not on file  Food Insecurity:   . Worried About Programme researcher, broadcasting/film/video in the Last Year: Not on file  . Ran Out of Food in the Last Year: Not on file  Transportation Needs:   . Lack of Transportation (Medical): Not on file  . Lack of Transportation (Non-Medical): Not on file  Physical Activity:   . Days of Exercise per Week: Not on file  . Minutes of Exercise per Session: Not on file  Stress:   . Feeling of Stress : Not on file  Social Connections:   . Frequency of Communication with Friends and Family: Not on  file  . Frequency of Social Gatherings with Friends and Family: Not on file  . Attends Religious Services: Not on file  . Active Member of Clubs or Organizations: Not on file  . Attends Banker Meetings: Not on file  . Marital Status: Not on file  Intimate Partner Violence:   . Fear of Current or Ex-Partner: Not on file  . Emotionally Abused: Not on file  . Physically Abused: Not on file  . Sexually Abused: Not on file     Current Outpatient Medications:  .  albuterol (PROVENTIL) (2.5 MG/3ML) 0.083% nebulizer solution, Take 3 mLs (2.5 mg total) by nebulization every 6 (six) hours as needed for wheezing or shortness of breath., Disp: 150 mL, Rfl: 0 .  amoxicillin-clavulanate  (AUGMENTIN) 875-125 MG tablet, Take 1 tablet by mouth 2 (two) times daily., Disp: 20 tablet, Rfl: 0 .  azithromycin (ZITHROMAX) 250 MG tablet, Take 2 tabs on day 1 followed by 1 tab daily on days 2-5, Disp: 6 tablet, Rfl: 0  EXAM:  VITALS per patient if applicable: BP (!) 146/88   Pulse 86   Temp 99.6 F (37.6 C)   Ht 6\' 1"  (1.854 m)   Wt (!) 310 lb (140.6 kg)   SpO2 95%   BMI 40.90 kg/m   GENERAL: alert, oriented, appears well and in no acute distress  HEENT: atraumatic, conjunttiva clear, no obvious abnormalities on inspection of external nose and ears  NECK: normal movements of the head and neck  LUNGS: on inspection no signs of respiratory distress, breathing rate appears normal, no obvious gross SOB, gasping or wheezing  CV: no obvious cyanosis  MS: moves all visible extremities without noticeable abnormality  PSYCH/NEURO: pleasant and cooperative, no obvious depression or anxiety, speech and thought processing grossly intact  ASSESSMENT AND PLAN:  Discussed the following assessment and plan:  CAP (community acquired pneumonia) History of CAP, now with similar symptoms. Rapid COVID negative but discussed that if symptoms worsen or don't start to improve over the next couple of days.  he should have PCR test.  Typically receives rocephin and azithromycin.  Will treat with augmentin and azithromycin Continue home supportive care with increased fluid intake and rest.      I discussed the assessment and treatment plan with the patient. The patient was provided an opportunity to ask questions and all were answered. The patient agreed with the plan and demonstrated an understanding of the instructions.   The patient was advised to call back or seek an in-person evaluation if the symptoms worsen or if the condition fails to improve as anticipated.    , DO

## 2020-08-21 NOTE — Progress Notes (Signed)
Symptoms started Tuesday with runny nose.  Sore throat Cough Chest Congestion  Feels like the pneumonia he's had previously. Usually takes 2 - 3 days before pneumonia develops.  Rapid COVID this morning: negative

## 2020-08-21 NOTE — Assessment & Plan Note (Signed)
History of CAP, now with similar symptoms. Rapid COVID negative but discussed that if symptoms worsen or don't start to improve over the next couple of days.  he should have PCR test.  Typically receives rocephin and azithromycin.  Will treat with augmentin and azithromycin Continue home supportive care with increased fluid intake and rest.

## 2020-10-01 DIAGNOSIS — D75A Glucose-6-phosphate dehydrogenase (G6PD) deficiency without anemia: Secondary | ICD-10-CM | POA: Diagnosis not present

## 2020-10-01 DIAGNOSIS — F419 Anxiety disorder, unspecified: Secondary | ICD-10-CM | POA: Diagnosis not present

## 2020-10-01 DIAGNOSIS — M2550 Pain in unspecified joint: Secondary | ICD-10-CM | POA: Diagnosis not present

## 2020-10-01 DIAGNOSIS — R5383 Other fatigue: Secondary | ICD-10-CM | POA: Diagnosis not present

## 2020-10-01 DIAGNOSIS — E669 Obesity, unspecified: Secondary | ICD-10-CM | POA: Diagnosis not present

## 2020-10-06 DIAGNOSIS — F419 Anxiety disorder, unspecified: Secondary | ICD-10-CM | POA: Diagnosis not present

## 2020-10-06 DIAGNOSIS — R5383 Other fatigue: Secondary | ICD-10-CM | POA: Diagnosis not present

## 2020-10-06 DIAGNOSIS — E669 Obesity, unspecified: Secondary | ICD-10-CM | POA: Diagnosis not present

## 2020-12-10 DIAGNOSIS — E669 Obesity, unspecified: Secondary | ICD-10-CM | POA: Diagnosis not present

## 2020-12-10 DIAGNOSIS — R5383 Other fatigue: Secondary | ICD-10-CM | POA: Diagnosis not present

## 2020-12-10 DIAGNOSIS — F419 Anxiety disorder, unspecified: Secondary | ICD-10-CM | POA: Diagnosis not present

## 2020-12-10 DIAGNOSIS — M2550 Pain in unspecified joint: Secondary | ICD-10-CM | POA: Diagnosis not present

## 2022-11-22 ENCOUNTER — Ambulatory Visit: Payer: BC Managed Care – PPO | Admitting: Sports Medicine
